# Patient Record
Sex: Male | Born: 1980 | Race: Black or African American | Hispanic: No | Marital: Single | Smoking: Current every day smoker
Health system: Southern US, Community
[De-identification: ages and names within clinical notes are randomized; demographics above are authoritative.]

## PROBLEM LIST (undated history)

## (undated) DIAGNOSIS — A539 Syphilis, unspecified: Secondary | ICD-10-CM

## (undated) DIAGNOSIS — B20 Human immunodeficiency virus [HIV] disease: Secondary | ICD-10-CM

## (undated) HISTORY — DX: Syphilis, unspecified: A53.9

## (undated) HISTORY — DX: Human immunodeficiency virus (HIV) disease: B20

---

## 2003-09-27 ENCOUNTER — Emergency Department (HOSPITAL_COMMUNITY): Admission: EM | Admit: 2003-09-27 | Discharge: 2003-09-27 | Payer: Self-pay | Admitting: Emergency Medicine

## 2004-10-03 ENCOUNTER — Emergency Department: Payer: Self-pay | Admitting: Emergency Medicine

## 2006-11-25 ENCOUNTER — Emergency Department: Payer: Self-pay | Admitting: Emergency Medicine

## 2008-04-20 IMAGING — CR DG SHOULDER 3+V*L*
1 series · 3 of 3 positions shown · non-contrast
Comparison: none

REASON FOR EXAM: injury   Minor care 1
COMMENTS:   LMP: (Male)

PROCEDURE:     DXR - DXR SHOULDER LEFT COMPLETE  - November 25, 2006  [DATE]
RESULT:     Comparison: No available comparison exam.

[Series 1: view not recorded · 0.17mm/px · 3 of 3 slices shown]
[im 1/3]
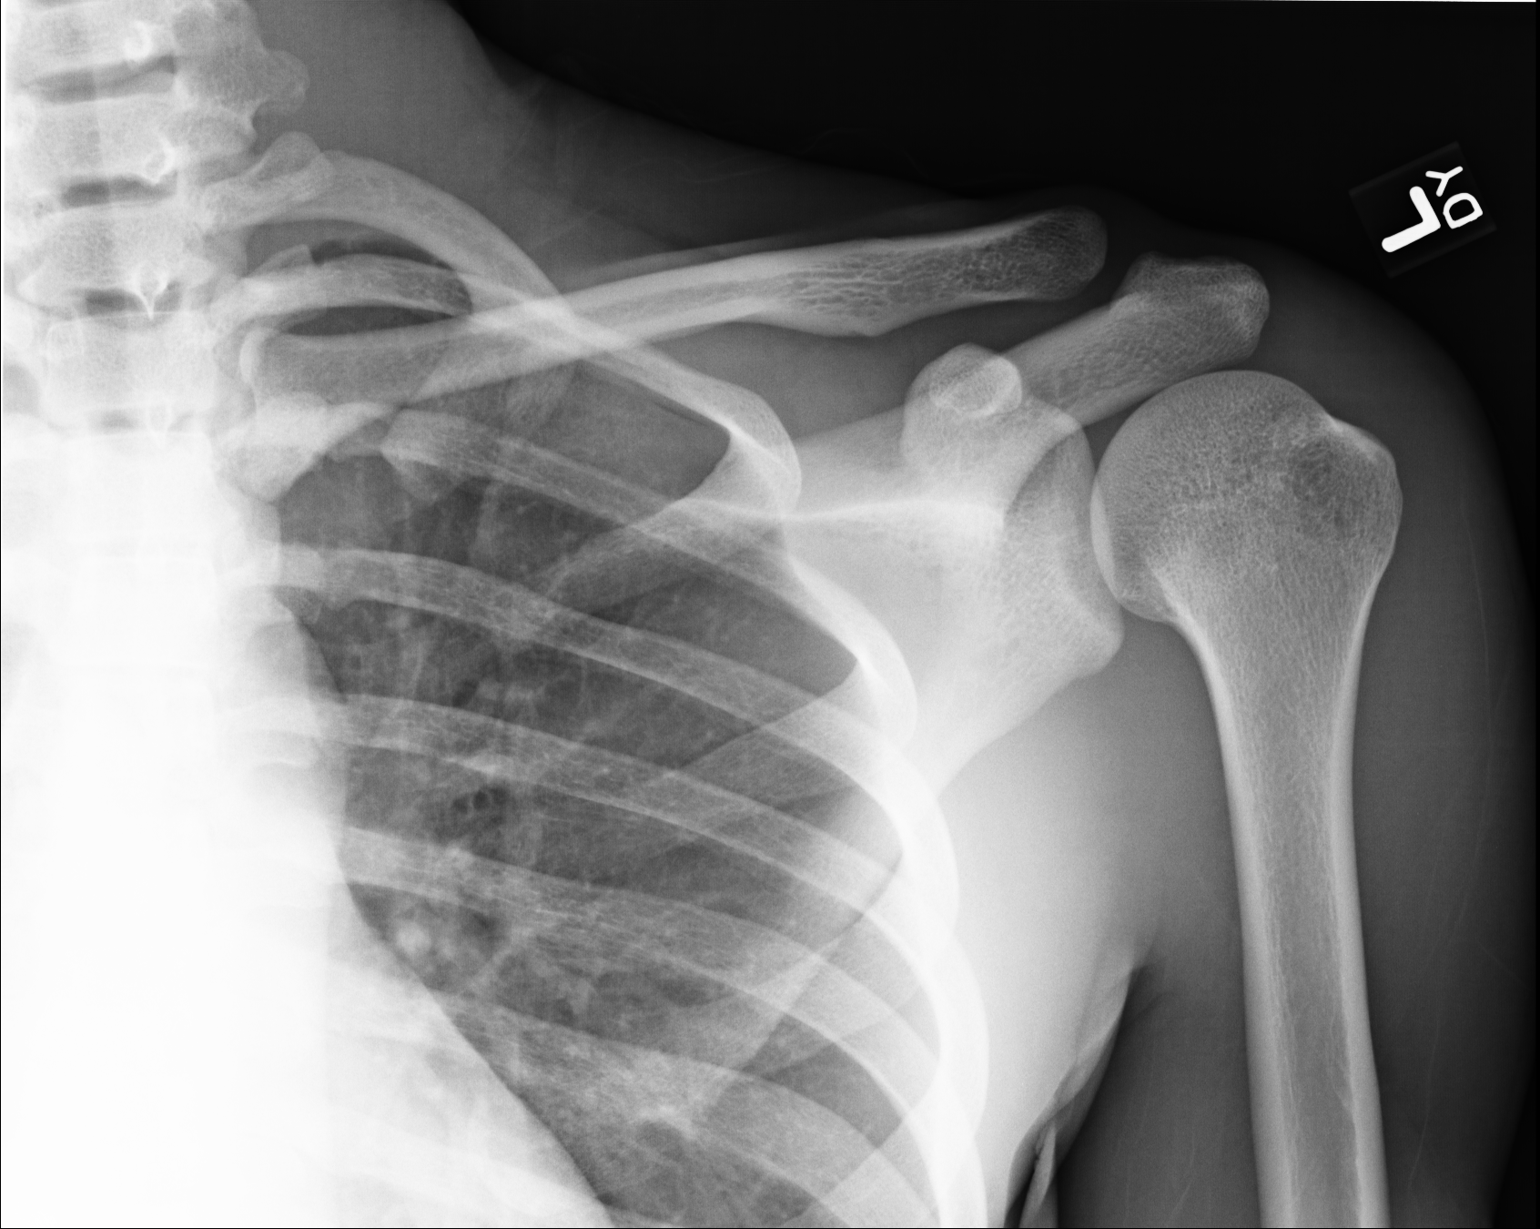
[im 2/3]
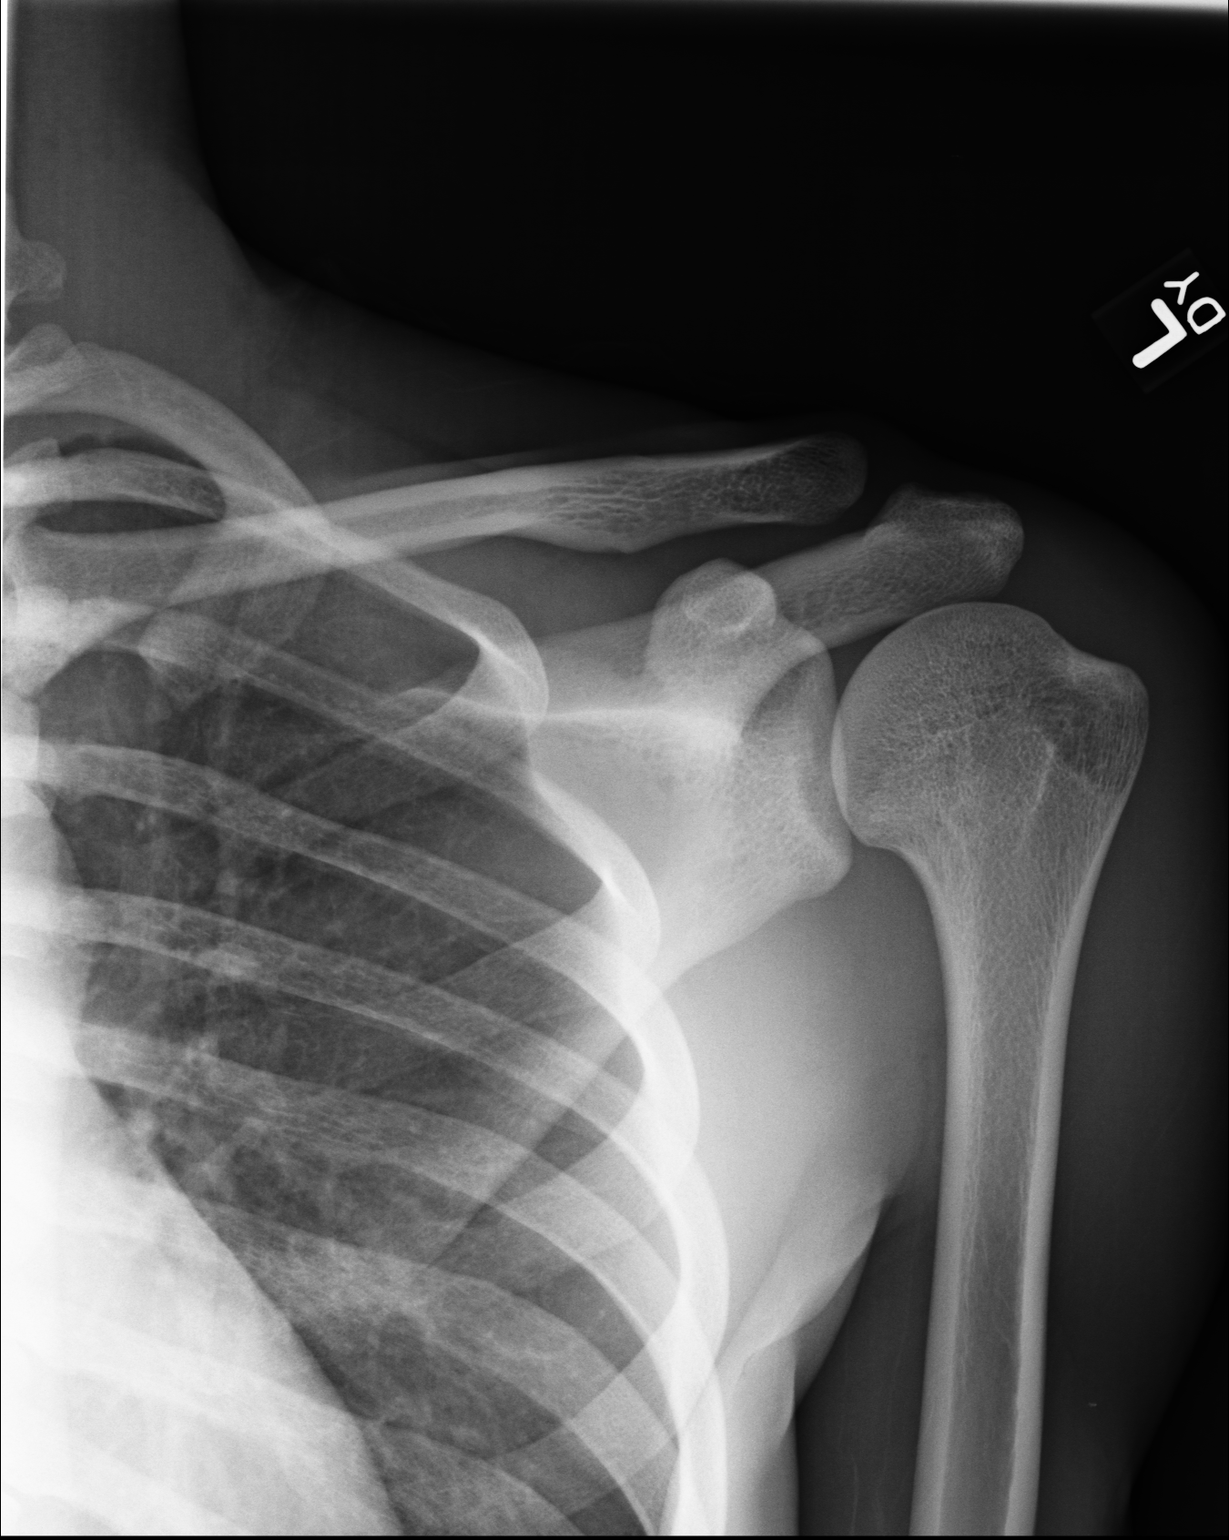
[im 3/3]
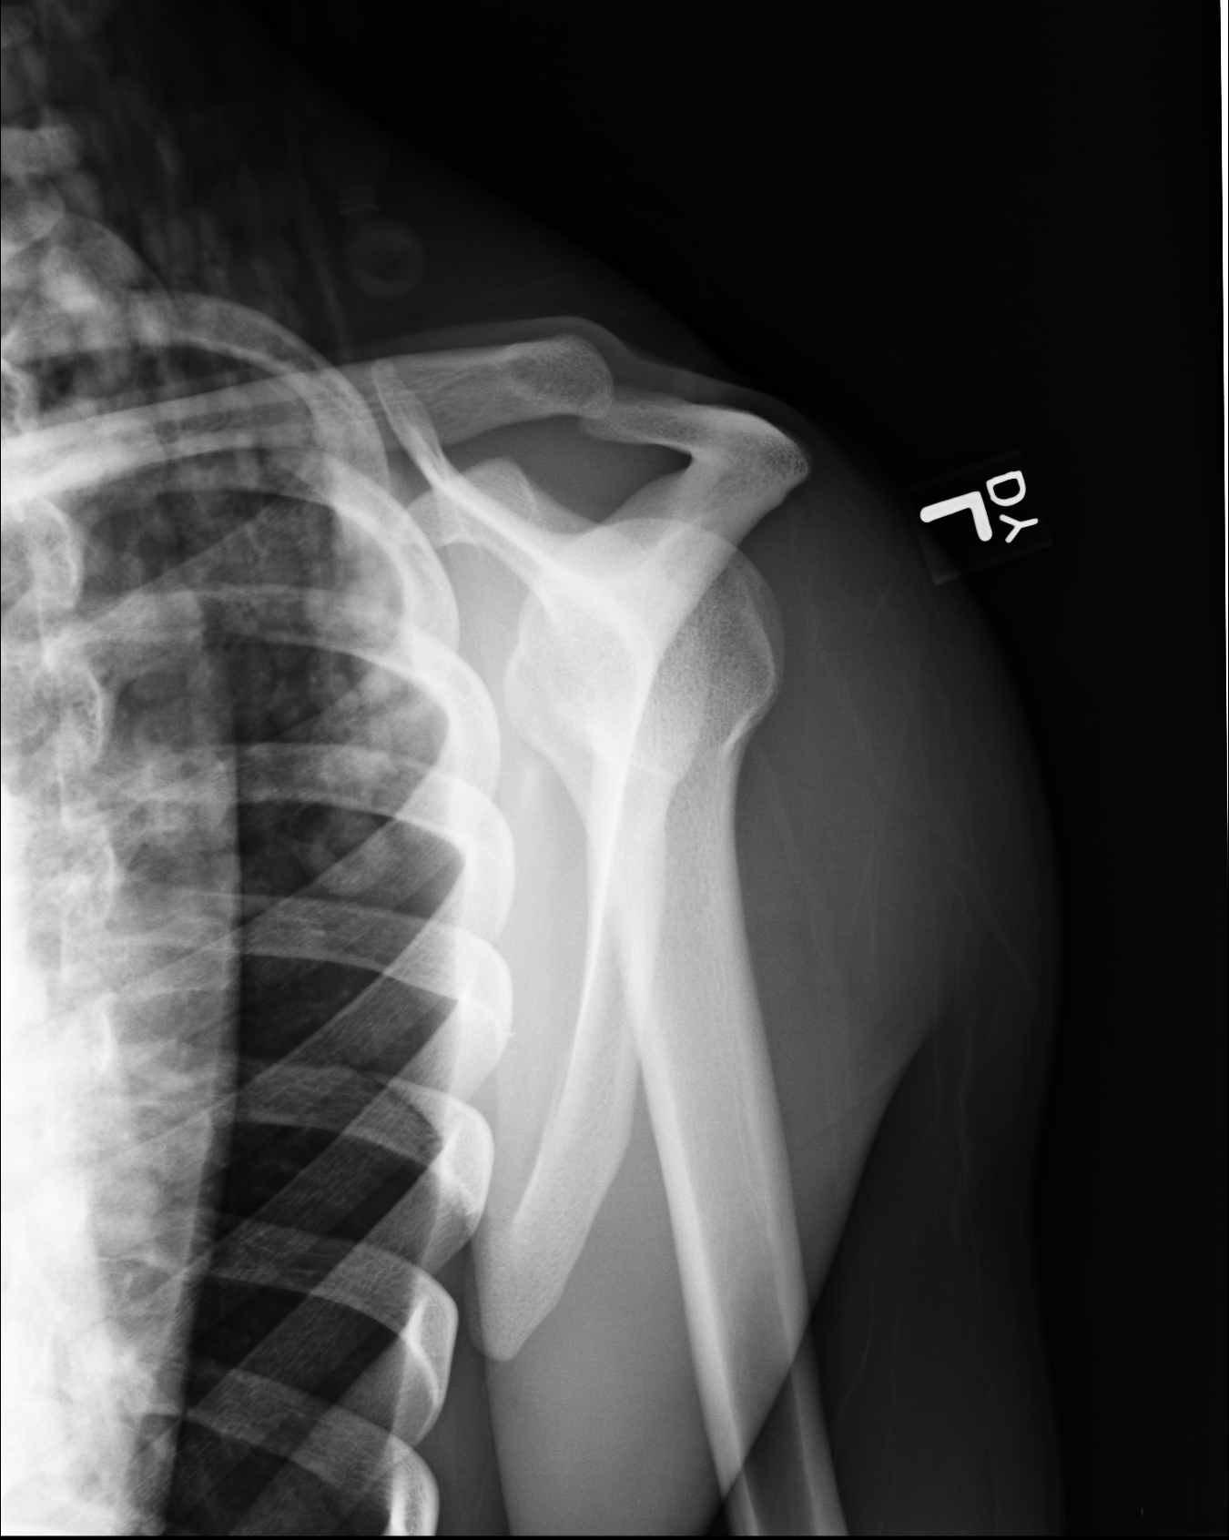

[3 of 3 positions shown; findings below may reference images not displayed]

FINDINGS: Three views of the left shoulder were obtained.

No fracture or dislocation of the left shoulder is seen. There is no
evidence of AC joint separation.
IMPRESSION: 1. Please see above.

## 2008-07-02 ENCOUNTER — Ambulatory Visit: Payer: Self-pay | Admitting: Internal Medicine

## 2008-07-02 DIAGNOSIS — B20 Human immunodeficiency virus [HIV] disease: Secondary | ICD-10-CM

## 2008-07-02 LAB — CONVERTED CEMR LAB
Basophils Absolute: 0.1 10*3/uL (ref 0.0–0.1)
Bilirubin Urine: NEGATIVE
CO2: 26 meq/L (ref 19–32)
Creatinine, Ser: 1.13 mg/dL (ref 0.40–1.50)
Eosinophils Absolute: 0.1 10*3/uL (ref 0.0–0.7)
GC Probe Amp, Urine: NEGATIVE
GFR calc Af Amer: >60 mL/min (ref >60)
GFR calc non Af Amer: >60 mL/min (ref >60)
Glucose, Bld: 78 mg/dL (ref 70–99)
HCV Ab: NEGATIVE
HDL: 54 mg/dL (ref >39)
HIV-2 Ab: UNDETERMINED — AB
Hep A Total Ab: POSITIVE — AB
Hep B S Ab: POSITIVE — AB
Hepatitis B Surface Ag: NEGATIVE
Ketones, ur: NEGATIVE mg/dL
Lymphocytes Relative: 25 % (ref 12–46)
Lymphs Abs: 1.5 10*3/uL (ref 0.7–4.0)
MCV: 71.4 fL — ABNORMAL LOW (ref 78.0–100.0)
Neutrophils Relative %: 66 % (ref 43–77)
Platelets: 213 10*3/uL (ref 150–400)
RDW: 14.8 % (ref 11.5–15.5)
Specific Gravity, Urine: 1.023 (ref 1.005–1.030)
Total Bilirubin: 1.4 mg/dL — ABNORMAL HIGH (ref 0.3–1.2)
Urine Glucose: NEGATIVE mg/dL
WBC: 5.8 10*3/uL (ref 4.0–10.5)
pH: 7 (ref 5.0–8.0)

## 2008-09-07 ENCOUNTER — Encounter: Payer: Self-pay | Admitting: Internal Medicine

## 2008-11-06 ENCOUNTER — Encounter (INDEPENDENT_AMBULATORY_CARE_PROVIDER_SITE_OTHER): Payer: Self-pay | Admitting: *Deleted

## 2009-03-23 ENCOUNTER — Telehealth (INDEPENDENT_AMBULATORY_CARE_PROVIDER_SITE_OTHER): Payer: Self-pay | Admitting: *Deleted

## 2009-05-26 ENCOUNTER — Ambulatory Visit: Payer: Self-pay | Admitting: Internal Medicine

## 2009-05-26 LAB — CONVERTED CEMR LAB
ALT: 24 units/L (ref 0–53)
Basophils Relative: 1 % (ref 0–1)
CO2: 28 meq/L (ref 19–32)
Calcium: 9 mg/dL (ref 8.4–10.5)
Chlamydia, Swab/Urine, PCR: NEGATIVE
Chloride: 105 meq/L (ref 96–112)
Creatinine, Ser: 1.11 mg/dL (ref 0.40–1.50)
Eosinophils Absolute: 0.1 10*3/uL (ref 0.0–0.7)
Glucose, Bld: 91 mg/dL (ref 70–99)
HCT: 39.7 % (ref 39.0–52.0)
HIV 1 RNA Quant: <48 copies/mL (ref <48)
HIV-1 RNA Quant, Log: <1.68 (ref <1.68)
Hemoglobin: 12.5 g/dL — ABNORMAL LOW (ref 13.0–17.0)
Lymphs Abs: 2.4 10*3/uL (ref 0.7–4.0)
MCHC: 31.5 g/dL (ref 30.0–36.0)
MCV: 73.2 fL — ABNORMAL LOW (ref 78.0–100.0)
Monocytes Absolute: 0.4 10*3/uL (ref 0.1–1.0)
Monocytes Relative: 6 % (ref 3–12)
RBC: 5.42 M/uL (ref 4.22–5.81)
Total Bilirubin: 0.8 mg/dL (ref 0.3–1.2)
Total Protein: 6.4 g/dL (ref 6.0–8.3)
WBC: 6.5 10*3/uL (ref 4.0–10.5)

## 2009-06-01 ENCOUNTER — Encounter: Payer: Self-pay | Admitting: Internal Medicine

## 2009-06-04 ENCOUNTER — Encounter (INDEPENDENT_AMBULATORY_CARE_PROVIDER_SITE_OTHER): Payer: Self-pay | Admitting: *Deleted

## 2009-06-10 ENCOUNTER — Ambulatory Visit: Payer: Self-pay | Admitting: Internal Medicine

## 2009-06-10 LAB — CONVERTED CEMR LAB
BUN: 15 mg/dL (ref 6–23)
Basophils Relative: 1 % (ref 0–1)
CO2: 27 meq/L (ref 19–32)
Calcium: 8.8 mg/dL (ref 8.4–10.5)
Chloride: 105 meq/L (ref 96–112)
Creatinine, Ser: 1.1 mg/dL (ref 0.40–1.50)
Eosinophils Absolute: 0.2 10*3/uL (ref 0.0–0.7)
Eosinophils Relative: 2 % (ref 0–5)
Glucose, Bld: 112 mg/dL — ABNORMAL HIGH (ref 70–99)
HCT: 42.4 % (ref 39.0–52.0)
HCV Ab: NEGATIVE
Hep A Total Ab: POSITIVE — AB
Hep B S Ab: POSITIVE — AB
Hepatitis B Surface Ag: NEGATIVE
Lymphs Abs: 2.8 10*3/uL (ref 0.7–4.0)
MCHC: 31.6 g/dL (ref 30.0–36.0)
MCV: 72 fL — ABNORMAL LOW (ref 78.0–100.0)
Monocytes Relative: 6 % (ref 3–12)
RBC: 5.89 M/uL — ABNORMAL HIGH (ref 4.22–5.81)
Total Bilirubin: 1.1 mg/dL (ref 0.3–1.2)
WBC: 7.4 10*3/uL (ref 4.0–10.5)

## 2009-06-16 ENCOUNTER — Encounter: Payer: Self-pay | Admitting: Internal Medicine

## 2009-06-16 ENCOUNTER — Encounter: Payer: Self-pay | Admitting: Infectious Diseases

## 2009-06-16 ENCOUNTER — Encounter (INDEPENDENT_AMBULATORY_CARE_PROVIDER_SITE_OTHER): Payer: Self-pay | Admitting: *Deleted

## 2009-06-25 ENCOUNTER — Encounter (INDEPENDENT_AMBULATORY_CARE_PROVIDER_SITE_OTHER): Payer: Self-pay | Admitting: *Deleted

## 2009-10-11 ENCOUNTER — Telehealth (INDEPENDENT_AMBULATORY_CARE_PROVIDER_SITE_OTHER): Payer: Self-pay | Admitting: *Deleted

## 2009-10-21 ENCOUNTER — Encounter (INDEPENDENT_AMBULATORY_CARE_PROVIDER_SITE_OTHER): Payer: Self-pay | Admitting: *Deleted

## 2009-10-27 ENCOUNTER — Telehealth (INDEPENDENT_AMBULATORY_CARE_PROVIDER_SITE_OTHER): Payer: Self-pay | Admitting: *Deleted

## 2009-11-25 ENCOUNTER — Ambulatory Visit: Payer: Self-pay | Admitting: Internal Medicine

## 2009-11-25 LAB — CONVERTED CEMR LAB
AST: 16 units/L (ref 0–37)
Albumin: 4.8 g/dL (ref 3.5–5.2)
Alkaline Phosphatase: 50 units/L (ref 39–117)
BUN: 21 mg/dL (ref 6–23)
Basophils Absolute: 0 10*3/uL (ref 0.0–0.1)
Basophils Relative: 0 % (ref 0–1)
Creatinine, Ser: 1.02 mg/dL (ref 0.40–1.50)
Eosinophils Absolute: 0.1 10*3/uL (ref 0.0–0.7)
MCHC: 31.7 g/dL (ref 30.0–36.0)
MCV: 71.5 fL — ABNORMAL LOW (ref 78.0–100.0)
Monocytes Relative: 9 % (ref 3–12)
Neutrophils Relative %: 64 % (ref 43–77)
Potassium: 4.7 meq/L (ref 3.5–5.3)
RBC: 5.12 M/uL (ref 4.22–5.81)
RDW: 17 % — ABNORMAL HIGH (ref 11.5–15.5)

## 2009-11-26 ENCOUNTER — Telehealth (INDEPENDENT_AMBULATORY_CARE_PROVIDER_SITE_OTHER): Payer: Self-pay | Admitting: *Deleted

## 2009-11-26 ENCOUNTER — Encounter: Payer: Self-pay | Admitting: Internal Medicine

## 2009-12-09 ENCOUNTER — Ambulatory Visit: Payer: Self-pay | Admitting: Internal Medicine

## 2009-12-09 ENCOUNTER — Telehealth (INDEPENDENT_AMBULATORY_CARE_PROVIDER_SITE_OTHER): Payer: Self-pay | Admitting: *Deleted

## 2010-02-23 NOTE — Progress Notes (Signed)
Summary: Patient Assistance - MegaceES  Phone Note Outgoing Call   Summary of Call: Advised patient that his Medication MegaceES has arrived and that he can pickup. Initial call taken by: Altamease Oiler,  October 27, 2009 5:29 PM

## 2010-02-23 NOTE — Progress Notes (Signed)
  Phone Note Outgoing Call   Call placed by: Sharol Roussel Summary of Call: patient to be enrolled into bridge counseling on 03/30/09

## 2010-02-23 NOTE — Miscellaneous (Signed)
Summary: Bridge Counselor  Clinical Lists Changes Pt enrolled into the [facility] program on 03/30/09. Pt has recently moved to the [place] area from [place], [place]. Pt prefers to be addressed as "[place]."  Sharol Roussel  Jun 04, 2009 2:25 PM

## 2010-02-23 NOTE — Progress Notes (Signed)
Summary: Megace samples  Phone Note Outgoing Call   Call placed by: Annice Pih Summary of Call: Left message for pt. that Megace samples are here and ready for pick up Initial call taken by: Wendall Mola CMA Duncan Dull),  December 09, 2009 12:16 PM

## 2010-02-23 NOTE — Miscellaneous (Signed)
Summary: clinical update/ryan white  Clinical Lists Changes  Observations: Added new observation of PATNTCOUNTY: [place] (10/21/2009 14:45) Added new observation of AIDSDAP: No (10/21/2009 14:45) Added new observation of PCTFPL: 101.28  (10/21/2009 14:45) Added new observation of HOUSEINCOME: [address]  (10/21/2009 14:45) Added new observation of FINASSESSDT: 10/21/2009  (10/21/2009 14:45)

## 2010-02-23 NOTE — Assessment & Plan Note (Signed)
Summary: new 042 labskam    Other Orders: T-CD4SP [facility]) (CD4SP) T-CBC w/Diff [phone]) T-Comprehensive Metabolic Panel [id number]) T-HIV Viral Load [phone]) T-RPR (Syphilis) [phone]) T-Chlamydia  Probe, urine [phone]) T-GC Probe, urine [phone])

## 2010-02-23 NOTE — Miscellaneous (Signed)
Summary: Bridge Counselor  Clinical Lists Changes  BC closed pt's file today. Pt was referred to [facility] for long term case management.  Sharol Roussel  June 25, 2009 12:43 PM

## 2010-02-23 NOTE — Assessment & Plan Note (Signed)
Summary: 28month f/u [mkj]   CC:  follow-up visit, lab results, refill Megace, and c/o right wrist pain.  History of Present Illness: patient is here for follow-up on his lab results.  He has been feeling well.  He has gained about 11 pounds since his last visit which he is very happy.  He needs a refill on his Megace  Preventive Screening-Counseling & Management  Alcohol-Tobacco     Alcohol drinks/day: 0     Smoking Status: current     Smoking Cessation Counseling: yes     Packs/Day: 0.5     Year Started: 2004  Caffeine-Diet-Exercise     Caffeine use/day: tea and soda 2 per day     Does Patient Exercise: yes     Type of exercise: yoga, dance, weights     Exercise (avg: min/session): >60     Times/week: 4  Safety-Violence-Falls     Seat Belt Use: yes      Drug Use:  Yes marijuana.    Comments: pt. given condoms   Updated Prior Medication List: MEGACE ES 625 MG/5ML SUSP (MEGESTROL ACETATE) 5 ml by mouth every day  Current Allergies (reviewed today): No known allergies  Review of Systems  The patient denies anorexia, fever, prolonged cough, and headaches.    Vital Signs:  Patient profile:   30 year old male Height:      70.5 inches (179.07 cm) Weight:      155.8 pounds (70.82 kg) BMI:     22.12 Temp:     99.4 degrees F (37.44 degrees C) oral Pulse rate:   67 / minute BP sitting:   130 / 87  (right arm)  Vitals Entered By: Wendall Mola CMA Duncan Dull) (December 09, 2009 9:42 AM) CC: follow-up visit, lab results, refill Megace, c/o right wrist pain Is Patient Diabetic? No Pain Assessment Patient in pain? yes     Location: right wrist Intensity: 4 Type: aching Onset of pain  Intermittent Nutritional Status BMI of 19 -24 = normal Nutritional Status Detail appetite "good"  Have you ever been in a relationship where you felt threatened, hurt or afraid?No   Does patient need assistance? Functional Status Self care Ambulation Normal   Physical  Exam  General:  alert, well-developed, well-nourished, and well-hydrated.   Head:  normocephalic and atraumatic.   Mouth:  pharynx pink and moist.   Lungs:  normal breath sounds.      Impression & Recommendations:  Problem # 1:  HIV INFECTION (ICD-042) patient is currently asymptomatic and not on medication.  He will follow-up for repeat labs in 6 months and see me two weeks after the period Diagnostics Reviewed:  HIV: REACTIVE (07/02/2008)   HIV-Western blot: Positive (06/10/2009)   CD4: 750 (11/26/2009)   WBC: 8.7 (11/25/2009)   Hgb: 11.6 (11/25/2009)   HCT: 36.6 (11/25/2009)   Platelets: 272 (11/25/2009) HIV-1 RNA: <20 copies/mL (11/25/2009)   HBSAg: NEG (06/10/2009)  Orders: Est. Patient Level III (99213)Future Orders: T-CD4SP ([facility]) (CD4SP) ... 06/07/2010 T-HIV Viral Load [phone]) ... 06/07/2010 T-Comprehensive Metabolic Panel [phone]) ... 06/07/2010 T-CBC w/Diff ([id number]) ... 06/07/2010  Patient Instructions: 1)  Please schedule a follow-up appointment in 6 months,2 weeks after labs.  Prescriptions: MEGACE ES 625 MG/5ML SUSP (MEGESTROL ACETATE) 5 ml by mouth every day  #150 ml x 2   Entered and Authorized by:   Yisroel Ramming MD   Signed by:   Yisroel Ramming MD on 12/09/2009   Method used:   Print then  Give to Patient   RxID:   [id number]    Not Administered:    Influenza Vaccine not given due to: declined

## 2010-02-23 NOTE — Consult Note (Signed)
Summary: New Pt. Referral:  New Pt. Referral:   Imported By: Florinda Marker 06/16/2009 15:21:13  _____________________________________________________________________  External Attachment:    Type:   Image     Comment:   External Document

## 2010-02-23 NOTE — Assessment & Plan Note (Signed)
Summary: new 042 labs 5/4   CC:  new patient and lab results.  History of Present Illness: This is the first ID clinic visit for [facility]. He was daignosed HIV (+) in 2007.  He has not been in care since his diagnosis. His risk factor is MSM. No current partner. He has always been small and would like to gain weight. He works out all of the time.  Preventive Screening-Counseling & Management  Alcohol-Tobacco     Alcohol drinks/day: 0     Smoking Status: current     Smoking Cessation Counseling: yes     Packs/Day: 0.5     Year Started: 2004  Caffeine-Diet-Exercise     Caffeine use/day: tea and soda 2 per day     Does Patient Exercise: yes     Type of exercise: yoga, dance, weights     Exercise (avg: min/session): >60     Times/week: 4  Safety-Violence-Falls     Seat Belt Use: yes      Drug Use:  Yes marijuana.    Comments: pt given condoms   Updated Prior Medication List: MEGACE ES 625 MG/5ML SUSP (MEGESTROL ACETATE) 5 ml by mouth every day  Current Allergies (reviewed today): No known allergies  Social History: Drug Use:  Yes marijuana  Review of Systems  The patient denies anorexia, fever, weight loss, and dyspnea on exertion.    Vital Signs:  Patient profile:   30 year old male Height:      70.5 inches (179.07 cm) Weight:      138.0 pounds (62.73 kg) BMI:     19.59 Temp:     98.0 degrees F (36.67 degrees C) oral Pulse rate:   69 / minute BP sitting:   120 / 75  (right arm)  Vitals Entered By: Wendall Mola CMA Duncan Dull) (Jun 10, 2009 2:14 PM) CC: new patient, lab results Is Patient Diabetic? No Pain Assessment Patient in pain? no      Nutritional Status BMI of 19 -24 = normal Nutritional Status Detail appetite "good"  Have you ever been in a relationship where you felt threatened, hurt or afraid?No   Does patient need assistance? Functional Status Self care Ambulation Normal   Physical Exam  General:  alert, well-hydrated, and underweight  appearing.   Head:  normocephalic and atraumatic.   Mouth:  pharynx pink and moist.  no thrush  Neck:  no cervical lymphadenopathy.   Lungs:  normal breath sounds.   Heart:  normal rate and regular rhythm.      Impression & Recommendations:  Problem # 1:  HIV INFECTION (ICD-042) Pt currently asymptomatic and not on treatment. VL <48 and CD4ct 980.  I will obtain a confirmatory HIV test. Repeat labs in 6 months if test comes back positive. megace to stimulate appetite. Orders: New Patient Level III (91478) T-Hepatitis B Surface Antigen [phone]) T-Hepatitis B Surface Antibody ([id number]) T-Hepatitis C Antibody ([id number]) T-Hepatitis A Antibody ([id number]) T-HIV Ab Confirmatory Test/Western Blot ([id number])LOVFIE Orders: T-CD4SP ([facility]) (CD4SP) ... 12/07/2009 T-HIV Viral Load [phone]) ... 12/07/2009 T-Comprehensive Metabolic Panel [id number]) ... 12/07/2009 T-CBC w/Diff ([id number]) ... 12/07/2009  Diagnostics Reviewed:  HIV: REACTIVE (07/02/2008)   HIV-Western blot: Positive (07/02/2008)   CD4: 980 (05/27/2009)   WBC: 6.5 (05/26/2009)   Hgb: 12.5 (05/26/2009)   HCT: 39.7 (05/26/2009)   Platelets: 207 (05/26/2009) HIV-1 RNA: <48 copies/mL (05/26/2009)   HBSAg: NEG (07/02/2008)  Medications Added to Medication List This Visit: 1)  Megace Es 625 Mg/51ml Susp (  Megestrol acetate) .... 5 ml by mouth every day  Patient Instructions: 1)  Please schedule a follow-up appointment in 6 months, 2 weeks after labs.  Prescriptions: MEGACE ES 625 MG/5ML SUSP (MEGESTROL ACETATE) 5 ml by mouth every day  #150 ml x 2   Entered and Authorized by:   Yisroel Ramming MD   Signed by:   Yisroel Ramming MD on 06/10/2009   Method used:   Print then Give to Patient   RxID:   [id number]

## 2010-02-23 NOTE — Miscellaneous (Signed)
Summary: Orders Update  Clinical Lists Changes  Orders: Added new Referral order of Misc. Referral (Misc. Ref) - Signed 

## 2010-02-23 NOTE — Progress Notes (Signed)
Summary: PPD  Phone Note Outgoing Call   Call placed by: Annice Pih Summary of Call: Pt. needs PPD at next office visit Initial call taken by: Wendall Mola CMA Duncan Dull),  October 11, 2009 12:59 PM

## 2010-02-23 NOTE — Progress Notes (Signed)
Summary: PAP rxes here.  Letter sent to pt.   Phone Note Outgoing Call   Call placed by: Jennet Maduro RN,  November 26, 2009 1:58 PM Call placed to: Patient Action Taken: Assistance medications ready for pick up Summary of Call: Megace PAP Lot # [id number] Exp. June 2014 # 150 mL Unable to leave message for the pt.  Phone #s have been disconnected.  Jennet Maduro RN  November 26, 2009 1:59 PM Letter Mailed to pt.  Jennet Maduro RN  November 26, 2009 2:10 PM

## 2010-02-23 NOTE — Letter (Signed)
Summary: [facility]  [address]   [place], [place] [address]   Phone: [phone]  Fax: [phone]          November 26, 2009  [place] [address] [place], [place]  [address]  Dear Mr. Vanderschaaf,  Your Patient Assistance Medications have arrived at the Center for your pick up.  Please come to the Center between 9-12:30 PM or 2:00-4:30 PM Monday through Friday.  Thank you.  Sincerely,    Jennet Maduro RN

## 2010-02-23 NOTE — Miscellaneous (Signed)
Summary: Bridge Counselor  Clinical Lists Changes BC met pt at the clinic for his 06/10/09 appointment following a home visit. Pt is doing well, has a job, transportation and is now close to the clinic. Pt does not plan to miss anymore appointments. BC introduced pt to Amy Faw with [facility] and will refer ct to [facility] for support services. BC will close this pt's file at the end of the month.  Kim Poff  Jun 16, 2009 1:12 PM

## 2010-02-23 NOTE — Miscellaneous (Signed)
Summary: [facility]izations Records   [facility] Records   Imported By: Florinda Marker 06/16/2009 13:58:11  _____________________________________________________________________  External Attachment:    Type:   Image     Comment:   External Document

## 2010-04-05 LAB — T-HELPER CELL (CD4) - (RCID CLINIC ONLY)
CD4 % Helper T Cell: 37 % (ref 33–55)
CD4 T Cell Abs: 750 uL (ref 400–2700)

## 2010-04-11 LAB — T-HELPER CELL (CD4) - (RCID CLINIC ONLY)
CD4 % Helper T Cell: 46 % (ref 33–55)
CD4 T Cell Abs: 1240 uL (ref 400–2700)

## 2010-04-12 LAB — T-HELPER CELL (CD4) - (RCID CLINIC ONLY)
CD4 % Helper T Cell: 43 % (ref 33–55)
CD4 T Cell Abs: 980 uL (ref 400–2700)

## 2010-05-02 LAB — T-HELPER CELL (CD4) - (RCID CLINIC ONLY): CD4 T Cell Abs: 650 uL (ref 400–2700)

## 2013-12-07 ENCOUNTER — Encounter (HOSPITAL_COMMUNITY): Payer: Self-pay

## 2013-12-07 ENCOUNTER — Emergency Department (HOSPITAL_COMMUNITY)
Admission: EM | Admit: 2013-12-07 | Discharge: 2013-12-07 | Discharge status: Against Medical Advice | Payer: Self-pay | Attending: Emergency Medicine | Admitting: Emergency Medicine

## 2013-12-07 ENCOUNTER — Emergency Department (HOSPITAL_COMMUNITY)
Admission: EM | Admit: 2013-12-07 | Discharge: 2013-12-07 | Disposition: A | Discharge status: Home / Self Care | Payer: Self-pay | Attending: Emergency Medicine | Admitting: Emergency Medicine

## 2013-12-07 DIAGNOSIS — Z720 Tobacco use: Secondary | ICD-10-CM | POA: Insufficient documentation

## 2013-12-07 DIAGNOSIS — K029 Dental caries, unspecified: Secondary | ICD-10-CM | POA: Insufficient documentation

## 2013-12-07 DIAGNOSIS — K088 Other specified disorders of teeth and supporting structures: Secondary | ICD-10-CM | POA: Insufficient documentation

## 2013-12-07 DIAGNOSIS — K047 Periapical abscess without sinus: Secondary | ICD-10-CM | POA: Insufficient documentation

## 2013-12-07 MED ORDER — CLINDAMYCIN HCL 300 MG PO CAPS: 300.0000 mg | ORAL_CAPSULE | Freq: Four times a day (QID) | ORAL | Status: DC

## 2013-12-07 MED ORDER — CLINDAMYCIN HCL 150 MG PO CAPS
300.0000 mg | ORAL_CAPSULE | Freq: Once | ORAL | Status: AC
  Administered 2013-12-07: 300 mg via ORAL
  Filled 2013-12-07: qty 2

## 2013-12-07 MED ORDER — HYDROCODONE-ACETAMINOPHEN 5-325 MG PO TABS: ORAL_TABLET | ORAL | Status: DC

## 2013-12-07 NOTE — Discharge Instructions (Signed)
Abscessed Tooth An abscessed tooth is an infection around your tooth. It may be caused by holes or damage to the tooth (cavity) or a dental disease. An abscessed tooth causes mild to very bad pain in and around the tooth. See your dentist right away if you have tooth or gum pain. HOME CARE  Take your medicine as told. Finish it even if you start to feel better.  Do not drive after taking pain medicine.  Rinse your mouth (gargle) often with salt water ( teaspoon salt in 8 ounces of warm water).  Do not apply heat to the outside of your face. GET HELP RIGHT AWAY IF:   You have a temperature by mouth above 102 F (38.9 C), not controlled by medicine.  You have chills and a very bad headache.  You have problems breathing or swallowing.  Your mouth will not open.  You develop puffiness (swelling) on the neck or around the eye.  Your pain is not helped by medicine.  Your pain is getting worse instead of better. MAKE SURE YOU:   Understand these instructions.  Will watch your condition.  Will get help right away if you are not doing well or get worse. Document Released: 06/28/2007 Document Revised: 04/03/2011 Document Reviewed: 04/19/2010 ExitCare Patient Information 2015 [employer]. This information is not intended to replace advice given to you by your health care provider. Make sure you discuss any questions you have with your health care provider.  

## 2013-12-07 NOTE — ED Provider Notes (Signed)
CSN: [id number]     Arrival date & time 12/07/13  1907 History  This chart was scribed for non-physician practitioner working with Donnetta HutchingBrian Cook, MD by Murriel HopperAlec Bankhead, ED Scribe. This patient was seen in room APFT21/APFT21 and the patient's care was started at 7:47 PM.    Chief Complaint  Patient presents with  . Abscess      The history is provided by the patient. No language interpreter was used.     HPI Comments: Taylor Mcguire is a 33 y.o. male who presents to the Emergency Department complaining of pain due to an abscessed tooth with associated facial swelling that has been present for about a week. Pt states the area ruptured and drainage of the abscess occurred PTA and that the pain is localized to his right lower jaw. Pt notes that he recently got a tongue ring and that he has a few chipped teeth, which could be contributing to the complaint. Pt denies fever or chills, difficulty swallowing or breathing.      History reviewed. No pertinent past medical history. History reviewed. No pertinent past surgical history. History reviewed. No pertinent family history. History  Substance Use Topics  . Smoking status: Current Every Day Smoker  . Smokeless tobacco: Not on file  . Alcohol Use: Yes     Comment: occ    Review of Systems  Constitutional: Negative for fever, chills and appetite change.  HENT: Positive for dental problem and facial swelling. Negative for congestion, sore throat and trouble swallowing.   Eyes: Negative for pain and visual disturbance.  Musculoskeletal: Negative for neck pain and neck stiffness.  Neurological: Negative for dizziness, facial asymmetry, speech difficulty, weakness and headaches.  Hematological: Negative for adenopathy.  All other systems reviewed and are negative.     Allergies  Review of patient's allergies indicates no known allergies.  Home Medications   Prior to Admission medications   Not on File   BP 136/80 mmHg  Pulse 70   Temp(Src) 99.2 F (37.3 C) (Oral)  Resp 20  Ht 5\' 10"  (1.778 m)  Wt 145 lb (65.772 kg)  BMI 20.81 kg/m2  SpO2 99% Physical Exam  Constitutional: He is oriented to person, place, and time. He appears well-developed and well-nourished. No distress.  HENT:  Head: Normocephalic and atraumatic.  Right Ear: Tympanic membrane and ear canal normal.  Left Ear: Tympanic membrane and ear canal normal.  Mouth/Throat: Uvula is midline, oropharynx is clear and moist and mucous membranes are normal. No trismus in the jaw. Dental caries present. No dental abscesses or uvula swelling.    Right lower 2nd premolar dental abscess with caries present. Moderate drainage Airway patent  Neck: Normal range of motion. Neck supple.  Cardiovascular: Normal rate, regular rhythm and normal heart sounds.   No murmur heard. Pulmonary/Chest: Effort normal and breath sounds normal.  Abdominal: He exhibits no distension.  Musculoskeletal: Normal range of motion.  Lymphadenopathy:    He has no cervical adenopathy.  Neurological: He is alert and oriented to person, place, and time. He exhibits normal muscle tone. Coordination normal.  Skin: Skin is warm and dry.  Psychiatric: He has a normal mood and affect.  Nursing note and vitals reviewed.   ED Course  Procedures (including critical care time)  DIAGNOSTIC STUDIES: Oxygen Saturation is 99% on RA, normal by my interpretation.    COORDINATION OF CARE: 7:51 PM Discussed treatment plan with pt at bedside and pt agreed to plan.   Labs Review Labs  Reviewed - No data to display  Imaging Review No results found.   EKG Interpretation None      MDM   Final diagnoses:  Dental abscess    Pt is well appearing.  No concerning sx's for infection to floor of the mouth or deep structures of the neck.  Referral info given for dentist.  Rx for clindamycin and vicodin.   I personally performed the services described in this documentation, which was scribed in  my presence. The recorded information has been reviewed and is accurate.    Zemirah Krasinski L. Trisha Mangleriplett, PA-C 12/08/13 0026  Donnetta HutchingBrian Cook, MD 12/11/13 2132

## 2013-12-07 NOTE — ED Notes (Signed)
Patient c/o abscess to right lower jaw that started a week ago. Patient states drainage that started upon arrival.

## 2013-12-07 NOTE — ED Notes (Signed)
Called name no answer 

## 2013-12-07 NOTE — ED Notes (Signed)
Called name second time.  No answer.

## 2013-12-07 NOTE — ED Notes (Signed)
Pt c/o toothache since last week and swelling to r jaw.

## 2016-06-16 ENCOUNTER — Encounter (HOSPITAL_COMMUNITY): Payer: Self-pay | Admitting: *Deleted

## 2016-06-16 ENCOUNTER — Emergency Department (HOSPITAL_COMMUNITY)
Admission: EM | Admit: 2016-06-16 | Discharge: 2016-06-16 | Disposition: A | Discharge status: Home / Self Care | Payer: BLUE CROSS/BLUE SHIELD | Attending: Emergency Medicine | Admitting: Emergency Medicine

## 2016-06-16 DIAGNOSIS — K047 Periapical abscess without sinus: Secondary | ICD-10-CM | POA: Insufficient documentation

## 2016-06-16 DIAGNOSIS — F17200 Nicotine dependence, unspecified, uncomplicated: Secondary | ICD-10-CM | POA: Diagnosis not present

## 2016-06-16 DIAGNOSIS — Z21 Asymptomatic human immunodeficiency virus [HIV] infection status: Secondary | ICD-10-CM | POA: Diagnosis not present

## 2016-06-16 DIAGNOSIS — B20 Human immunodeficiency virus [HIV] disease: Secondary | ICD-10-CM

## 2016-06-16 DIAGNOSIS — K0889 Other specified disorders of teeth and supporting structures: Secondary | ICD-10-CM | POA: Diagnosis present

## 2016-06-16 MED ORDER — ONDANSETRON HCL 4 MG PO TABS
4.0000 mg | ORAL_TABLET | Freq: Once | ORAL | Status: AC
  Administered 2016-06-16: 4 mg via ORAL
  Filled 2016-06-16: qty 1

## 2016-06-16 MED ORDER — TRAMADOL HCL 50 MG PO TABS: ORAL_TABLET | ORAL | 0 refills | Status: DC

## 2016-06-16 MED ORDER — IBUPROFEN 600 MG PO TABS: 600.0000 mg | ORAL_TABLET | Freq: Four times a day (QID) | ORAL | 0 refills | Status: DC

## 2016-06-16 MED ORDER — IBUPROFEN 800 MG PO TABS
800.0000 mg | ORAL_TABLET | Freq: Once | ORAL | Status: AC
  Administered 2016-06-16: 800 mg via ORAL
  Filled 2016-06-16: qty 1

## 2016-06-16 MED ORDER — CLINDAMYCIN HCL 150 MG PO CAPS
300.0000 mg | ORAL_CAPSULE | Freq: Once | ORAL | Status: AC
  Administered 2016-06-16: 300 mg via ORAL
  Filled 2016-06-16: qty 2

## 2016-06-16 MED ORDER — CLINDAMYCIN HCL 300 MG PO CAPS: 300.0000 mg | ORAL_CAPSULE | Freq: Three times a day (TID) | ORAL | 0 refills | Status: DC

## 2016-06-16 NOTE — ED Triage Notes (Signed)
Pt has a reported abscess to lower R tooth-  Pt has dental insurance, but will need a referral

## 2016-06-16 NOTE — ED Provider Notes (Signed)
[facility]-EMERG[facility]Y DEPT Provider Note   CSN: [id number] Arrival date & time: 06/16/16  2056     History   Chief Complaint No chief complaint on file.   HPI Taylor Mcguire is a 36 y.o. male.  Pt states he is has hx of HIV infection. States it has been years since he was seen for this problem. He does not remember his latest CD4 count.   The history is provided by the patient.  Dental Pain   This is a new problem. The current episode started 2 days ago. The problem occurs constantly. The problem has been gradually worsening. The pain is moderate. He has tried acetaminophen for the symptoms. The treatment provided no relief.    No past medical history on file.  Patient Active Problem List   Diagnosis Date Noted  . HIV INFECTION 07/02/2008    No past surgical history on file.     Home Medications    Prior to Admission medications   Medication Sig Start Date End Date Taking? Authorizing Provider  clindamycin (CLEOCIN) 300 MG capsule Take 1 capsule (300 mg total) by mouth 4 (four) times daily. For 10 days 12/07/13   Pauline Ausriplett, Tammy, PA-C  HYDROcodone-acetaminophen (NORCO/VICODIN) 5-325 MG per tablet Take one-two tabs po q 4-6 hrs prn pain 12/07/13   Triplett, Tammy, PA-C    Family History No family history on file.  Social History Social History  Substance Use Topics  . Smoking status: Current Every Day Smoker  . Smokeless tobacco: Not on file  . Alcohol use Yes     Comment: occ     Allergies   Patient has no known allergies.   Review of Systems Review of Systems  Constitutional: Negative for activity change and appetite change.  HENT: Positive for dental problem. Negative for congestion, ear discharge, ear pain, facial swelling, nosebleeds, rhinorrhea, sneezing and tinnitus.   Eyes: Negative for photophobia, pain and discharge.  Respiratory: Negative for cough, choking, shortness of breath and wheezing.   Cardiovascular: Negative for chest pain, palpitations and  leg swelling.  Gastrointestinal: Negative for abdominal pain, blood in stool, constipation, diarrhea, nausea and vomiting.  Genitourinary: Negative for difficulty urinating, dysuria, flank pain, frequency and hematuria.  Musculoskeletal: Negative for back pain, gait problem, myalgias and neck pain.  Skin: Negative for color change, rash and wound.  Neurological: Negative for dizziness, seizures, syncope, facial asymmetry, speech difficulty, weakness and numbness.  Hematological: Negative for adenopathy. Does not bruise/bleed easily.  Psychiatric/Behavioral: Negative for agitation, confusion, hallucinations, self-injury and suicidal ideas. The patient is not nervous/anxious.      Physical Exam Updated Vital Signs There were no vitals taken for this visit.  Physical Exam  Constitutional: He is oriented to person, place, and time. He appears well-developed and well-nourished.  Non-toxic appearance.  HENT:  Head: Normocephalic.  Right Ear: Tympanic membrane and external ear normal.  Left Ear: Tympanic membrane and external ear normal.  Mouth/Throat:    There are multiple dental caries on the right. There is swelling of the gum on the right lower jaw area. There is tenderness to palpation. There is no swelling under the tongue. Airway is patent. Uvula is in the midline.  Eyes: EOM and lids are normal. Pupils are equal, round, and reactive to light.  Neck: Normal range of motion. Neck supple. Carotid bruit is not present.  Few palpable submental nodes appreciated.  Cardiovascular: Normal rate, regular rhythm, normal heart sounds, intact distal pulses and normal pulses.   Pulmonary/Chest: Breath  sounds normal. No respiratory distress.  Abdominal: Soft. Bowel sounds are normal. There is no tenderness. There is no guarding.  Musculoskeletal: Normal range of motion.  Lymphadenopathy:       Head (right side): No submandibular adenopathy present.       Head (left side): No submandibular  adenopathy present.    He has no cervical adenopathy.  Neurological: He is alert and oriented to person, place, and time. He has normal strength. No cranial nerve deficit or sensory deficit.  Skin: Skin is warm and dry.  Psychiatric: He has a normal mood and affect. His speech is normal.  Nursing note and vitals reviewed.    ED Treatments / Results  Labs (all labs ordered are listed, but only abnormal results are displayed) Labs Reviewed - No data to display  EKG  EKG Interpretation None       Radiology No results found.  Procedures Procedures (including critical care time)  Medications Ordered in ED Medications - No data to display   Initial Impression / Assessment and Plan / ED Course  I have reviewed the triage vital signs and the nursing notes.  Pertinent labs & imaging results that were available during my care of the patient were reviewed by me and considered in my medical decision making (see chart for details).      Final Clinical Impressions(s) / ED Diagnoses mdM Vital signs stable. Examination reveals swelling of the lower jaw on the right. Patient has dental caries present. No visible abscess appreciated. The patient is HIV positive, and states that he is not had any evaluation or treatment for this in over a year. I strongly encouraged patient to see the members of th [facility] for recheck of his abscess until he is seen by dentist, as well as for assessment of his HIV disease. The patient acknowledges understanding of the instructions. Prescription for clindamycin, ibuprofen, and Ultram given to the patient.    Final diagnoses:  Dental abscess  HIV infection (HCC)    New Prescriptions New Prescriptions   CLINDAMYCIN (CLEOCIN) 300 MG CAPSULE    Take 1 capsule (300 mg total) by mouth 3 (three) times daily. Take medication with food   IBUPROFEN (ADVIL,MOTRIN) 600 MG TABLET    Take 1 tablet (600 mg total) by mouth 4 (four) times daily.   TRAMADOL  (ULTRAM) 50 MG TABLET    1 or 2 po q6h prn pain     Ivery Quale, PA-C 06/16/16 2125    Ivery Quale, PA-C 06/16/16 2127    Donnetta Hutching, MD 06/17/16 [phone]

## 2016-06-16 NOTE — Discharge Instructions (Signed)
Your temperature is 99.7, otherwise your vital signs are within normal limits. Your examination reveals an abscess involving teeth on the right side of your lower jaw. Please use clindamycin 3 times daily with food. Please use ibuprofen 4 times daily with food for swelling and inflammation and pain. May use Ultram for more severe pain if needed. It is important that you see a member of the [facility] as soon as possible for follow-up of your HIV and for follow-up of your abscess to your seen by dentist. Please see a dentist as soon as possible.

## 2016-06-16 NOTE — ED Triage Notes (Signed)
Swelling right jaw

## 2021-01-12 ENCOUNTER — Other Ambulatory Visit: Payer: Self-pay

## 2021-01-12 ENCOUNTER — Encounter: Payer: Self-pay | Admitting: Internal Medicine

## 2021-01-12 DIAGNOSIS — B20 Human immunodeficiency virus [HIV] disease: Secondary | ICD-10-CM

## 2021-01-12 DIAGNOSIS — Z79899 Other long term (current) drug therapy: Secondary | ICD-10-CM

## 2021-01-12 DIAGNOSIS — Z114 Encounter for screening for human immunodeficiency virus [HIV]: Secondary | ICD-10-CM

## 2021-01-12 DIAGNOSIS — Z113 Encounter for screening for infections with a predominantly sexual mode of transmission: Secondary | ICD-10-CM

## 2021-01-12 LAB — URINALYSIS
Bilirubin Urine: NEGATIVE
Glucose, UA: NEGATIVE
Hgb urine dipstick: NEGATIVE
Ketones, ur: NEGATIVE
Leukocytes,Ua: NEGATIVE
Nitrite: NEGATIVE
Protein, ur: NEGATIVE
Specific Gravity, Urine: 1.021 (ref 1.001–1.035)
pH: 5.5 (ref 5.0–8.0)

## 2021-01-13 LAB — URINE CYTOLOGY ANCILLARY ONLY
Chlamydia: NEGATIVE
Comment: NEGATIVE
Comment: NORMAL
Neisseria Gonorrhea: NEGATIVE

## 2021-01-14 ENCOUNTER — Encounter (HOSPITAL_COMMUNITY): Payer: Self-pay | Admitting: *Deleted

## 2021-01-14 ENCOUNTER — Other Ambulatory Visit: Payer: Self-pay

## 2021-01-14 ENCOUNTER — Emergency Department (HOSPITAL_COMMUNITY)
Admission: EM | Admit: 2021-01-14 | Discharge: 2021-01-14 | Disposition: A | Discharge status: Home / Self Care | Payer: Self-pay | Attending: Emergency Medicine | Admitting: Emergency Medicine

## 2021-01-14 DIAGNOSIS — H6122 Impacted cerumen, left ear: Secondary | ICD-10-CM | POA: Insufficient documentation

## 2021-01-14 DIAGNOSIS — Z21 Asymptomatic human immunodeficiency virus [HIV] infection status: Secondary | ICD-10-CM | POA: Insufficient documentation

## 2021-01-14 DIAGNOSIS — F17200 Nicotine dependence, unspecified, uncomplicated: Secondary | ICD-10-CM | POA: Insufficient documentation

## 2021-01-14 DIAGNOSIS — Z79899 Other long term (current) drug therapy: Secondary | ICD-10-CM | POA: Insufficient documentation

## 2021-01-14 LAB — T-HELPER CELL (CD4) - (RCID CLINIC ONLY)
CD4 % Helper T Cell: 39 % (ref 33–65)
CD4 T Cell Abs: 805 /uL (ref 400–1790)

## 2021-01-14 MED ORDER — HYDROGEN PEROXIDE 3 % EX SOLN
CUTANEOUS | Status: AC
  Filled 2021-01-14: qty 473

## 2021-01-14 NOTE — ED Triage Notes (Signed)
Pt with muffled sounds and pressure to left ear for several weeks.

## 2021-01-14 NOTE — ED Provider Notes (Signed)
[facility] EMERGENCY DEPARTMENT Provider Note   CSN: [id number] Arrival date & time: 01/14/21  1836     History Chief Complaint  Patient presents with   Taylor Mcguire is a 40 y.o. male. Patient is with 1 month of decreased hearing and feeling like his left ear is clogged.  He tried the otic drops with no relief of symptoms.  He denies any ear pain, drainage, fevers, chills.  He denies any headaches, nasal congestion, sore throat.  Otalgia Associated symptoms: hearing loss   Associated symptoms: no congestion, no ear discharge, no fever and no sore throat       History reviewed. No pertinent past medical history.  Patient Active Problem List   Diagnosis Date Noted   HIV INFECTION 07/02/2008    History reviewed. No pertinent surgical history.     History reviewed. No pertinent family history.  Social History   Tobacco Use   Smoking status: Every Day   Smokeless tobacco: Never  Substance Use Topics   Alcohol use: Yes    Comment: occ   Drug use: No    Home Medications Prior to Admission medications   Medication Sig Start Date End Date Taking? Authorizing Provider  clindamycin (CLEOCIN) 300 MG capsule Take 1 capsule (300 mg total) by mouth 3 (three) times daily. Take medication with food 06/16/16   Ivery Quale, PA-C  HYDROcodone-acetaminophen (NORCO/VICODIN) 5-325 MG per tablet Take one-two tabs po q 4-6 hrs prn pain 12/07/13   Triplett, Tammy, PA-C  ibuprofen (ADVIL,MOTRIN) 600 MG tablet Take 1 tablet (600 mg total) by mouth 4 (four) times daily. 06/16/16   Ivery Quale, PA-C  traMADol Janean Sark) 50 MG tablet 1 or 2 po q6h prn pain 06/16/16   Ivery Quale, PA-C    Allergies    Patient has no known allergies.  Review of Systems   Review of Systems  Constitutional:  Negative for fever.  HENT:  Positive for hearing loss. Negative for congestion, ear discharge, ear pain and sore throat.   All other systems reviewed and are negative.  Physical  Exam Updated Vital Signs BP 122/84 (BP Location: Right Arm)    Pulse 68    Temp 97.8 F (36.6 C) (Oral)    Resp 17    Ht 5\' 11"  (1.803 m)    Wt 68 kg    SpO2 99%    BMI 20.92 kg/m   Physical Exam Vitals and nursing note reviewed.  Constitutional:      General: He is not in acute distress.    Appearance: Normal appearance. He is well-developed. He is not ill-appearing, toxic-appearing or diaphoretic.  HENT:     Head: Normocephalic and atraumatic.     Right Ear: Tympanic membrane normal.     Left Ear: Decreased hearing noted. There is impacted cerumen.     Nose: No nasal deformity.     Mouth/Throat:     Lips: Pink. No lesions.  Eyes:     General: Gaze aligned appropriately. No scleral icterus.       Right eye: No discharge.        Left eye: No discharge.     Conjunctiva/sclera: Conjunctivae normal.     Right eye: Right conjunctiva is not injected. No exudate or hemorrhage.    Left eye: Left conjunctiva is not injected. No exudate or hemorrhage. Pulmonary:     Effort: Pulmonary effort is normal. No respiratory distress.  Skin:    General: Skin is warm and  dry.  Neurological:     Mental Status: He is alert and oriented to person, place, and time.  Psychiatric:        Mood and Affect: Mood normal.        Speech: Speech normal.        Behavior: Behavior normal. Behavior is cooperative.    ED Results / Procedures / Treatments   Labs (all labs ordered are listed, but only abnormal results are displayed) Labs Reviewed - No data to display  EKG None  Radiology No results found.  Procedures Procedures   Medications Ordered in ED Medications  hydrogen peroxide 3 % external solution (  Given 01/14/21 1919)    ED Course  I have reviewed the triage vital signs and the nursing notes.  Pertinent labs & imaging results that were available during my care of the patient were reviewed by me and considered in my medical decision making (see chart for details).    MDM  Rules/Calculators/A&P                          Patient with left ear muffling sounds and decreased hearing for 1 month. No other concerning symptoms such as ear pain, congestion, sore throat, fevers, or chills.  Physical exam reveals obstruction of left ear.  Cannot visualize TM.  Will clean out left ear.  Right TM is visible with no obstruction.  Point earwax removal, patient states he is able to hear normally again.  Reevaluated TM and does have some erythema likely due to the cleanout process.  Wax was completely removed.  Patient able to be discharged home.  Final Clinical Impression(s) / ED Diagnoses Final diagnoses:  Impacted cerumen of left ear    Rx / DC Orders ED Discharge Orders     None        Claudie Leach, PA-C 01/14/21 2208    Maia Plan, MD 01/18/21 [phone]

## 2021-01-14 NOTE — ED Notes (Signed)
Pt states "much better" and feels like they can hear better

## 2021-01-20 ENCOUNTER — Other Ambulatory Visit (HOSPITAL_COMMUNITY): Payer: Self-pay

## 2021-01-25 ENCOUNTER — Ambulatory Visit (INDEPENDENT_AMBULATORY_CARE_PROVIDER_SITE_OTHER): Payer: Self-pay | Admitting: Infectious Diseases

## 2021-01-25 ENCOUNTER — Encounter: Payer: Self-pay | Admitting: Infectious Diseases

## 2021-01-25 ENCOUNTER — Ambulatory Visit: Payer: Self-pay

## 2021-01-25 ENCOUNTER — Other Ambulatory Visit: Payer: Self-pay

## 2021-01-25 ENCOUNTER — Encounter: Payer: BLUE CROSS/BLUE SHIELD | Admitting: Internal Medicine

## 2021-01-25 VITALS — BP 145/86 | HR 67 | Temp 97.4°F | Wt 164.0 lb

## 2021-01-25 DIAGNOSIS — Z7185 Encounter for immunization safety counseling: Secondary | ICD-10-CM

## 2021-01-25 DIAGNOSIS — B20 Human immunodeficiency virus [HIV] disease: Secondary | ICD-10-CM

## 2021-01-25 DIAGNOSIS — A539 Syphilis, unspecified: Secondary | ICD-10-CM

## 2021-01-25 DIAGNOSIS — Z113 Encounter for screening for infections with a predominantly sexual mode of transmission: Secondary | ICD-10-CM | POA: Insufficient documentation

## 2021-01-25 DIAGNOSIS — F17200 Nicotine dependence, unspecified, uncomplicated: Secondary | ICD-10-CM

## 2021-01-25 DIAGNOSIS — Z720 Tobacco use: Secondary | ICD-10-CM | POA: Insufficient documentation

## 2021-01-25 MED ORDER — PENICILLIN G BENZATHINE 1200000 UNIT/2ML IM SUSY
1.2000 10*6.[IU] | PREFILLED_SYRINGE | Freq: Once | INTRAMUSCULAR | Status: AC
  Administered 2021-01-25: 1.2 10*6.[IU] via INTRAMUSCULAR

## 2021-01-25 NOTE — Progress Notes (Addendum)
[address], [place], [place], [id number]                                                                  Phn. [phone]; Fax: [phone]                                                                             Date: 01/26/20  Reason for Visit: HIV Rapid start  Primary Care provider: None    HPI: Taylor Mcguire is a 41 y.o.old male with a history of HIV here for initial visit for HIV.   He was diagnosed with HIV at the age of 41 when he was tested because of carbuncles in his left arm. He was undetectable at that time. He denies being sexually active at the time of diagnosis. He was tested several times periodically and remained undetectable and was told he did not need treatment. He has not received any treatment so far and tells me he has always remained undetectable. Denies any h/o Ois, STDS, hospitalizations. Denies being sexually active recently, Last sexual encounter was 3 months ago and was with a protection. He perfers male sexual partners. He does not prefer receptive intercourse because of the pain unless he loves the person dearly.   Denies any other medical conditions, surgeries and denies being on any medications currently.  He works as a Secretary/administratorcover operator Smokes 4-5 cigarettes a day, marijuana +, alcohol socially Lives alone  HIV H/o  05/07/2005 - HIV EIA positive, HIV western blot positive   ROS: Denies dysphagia, odynophagia, cough, fever, nausea, vomiting, diarrhea, constipation, weight loss, chills, night sweats, recent hospitalizations, rashes, joint complaints, shortness of breath, headaches, chest pain, abdominal pain, dysuria .  No current outpatient medications on file prior to visit.   No current facility-administered medications on file prior to visit.                      No Known Allergies  PMH - none PSH - none  Social History   Socioeconomic History   Marital status: Single    Spouse name: Not on file   Number of children: Not on file   Years of education: Not on file   Highest education level: Not on file  Occupational History   Not on file  Tobacco Use   Smoking status: Every Day   Smokeless tobacco: Never  Substance and Sexual Activity   Alcohol use: Yes    Comment: occ   Drug use: Yes    Types: Marijuana   Sexual activity: Yes    Partners: Male    Birth  control/protection: None    Comment: accepted condoms  Other Topics Concern   Not on file  Social History Narrative   Not on file   Social Determinants of Health   Financial Resource Strain: Not on file  Food Insecurity: Not on file  Transportation Needs: Not on file  Physical Activity: Not on file  Stress: Not on file  Social Connections: Not on file  Intimate Partner Violence: Not on file   No family history on file.  Vitals  BP (!) 145/86    Pulse 67    Temp (!) 97.4 F (36.3 C) (Oral)    Wt 164 lb (74.4 kg)    BMI 22.87 kg/m   Examination  Gen: Alert and oriented x 3, no acute distress HEENT: [place]/AT, no scleral icterus, no pale conjunctivae, hearing normal, oral mucosa moist Neck: Supple Cardio: Regular rate and rhythm; +S1 and S2 Resp: CTAB GI: Soft, nontender, nondistended GU: Musc: Extremities: No cyanosis, clubbing, or edema Skin: No rashes, lesions, or ecchymoses Neuro: grossly non focal  Psych: Calm, cooperative  Lab Results HIV 1 RNA Quant (copies/mL)  Date Value  01/12/2021 NOT DETECTED  11/25/2009 <20 copies/mL  06/10/2009 <48 copies/mL   CD4 T Cell Abs  Date Value  01/12/2021 805 /uL  11/25/2009 750 cmm  06/10/2009 1240 cmm   No results found for: HIV1GENOSEQ Lab Results  Component Value Date   WBC 6.2 01/12/2021   HGB 14.2 01/12/2021   HCT 46.1 01/12/2021   MCV 73.4 (L) 01/12/2021   PLT 245 01/12/2021    Lab Results   Component Value Date   CREATININE 1.08 01/12/2021   BUN 17 01/12/2021   NA 140 01/12/2021   K 4.1 01/12/2021   CL 104 01/12/2021   CO2 30 01/12/2021   Lab Results  Component Value Date   ALT 39 01/12/2021   AST 24 01/12/2021   ALKPHOS 50 11/25/2009   BILITOT 1.4 (H) 01/12/2021    Lab Results  Component Value Date   CHOL 178 01/12/2021   TRIG 126 01/12/2021   HDL 52 01/12/2021   LDLCALC 103 (H) 01/12/2021   Lab Results  Component Value Date   HAV REACTIVE (A) 01/12/2021   Lab Results  Component Value Date   HEPBSAG NON-REACTIVE 01/12/2021   HEPBSAB REACTIVE (A) 01/12/2021   Lab Results  Component Value Date   HCVAB NEG 06/10/2009   Lab Results  Component Value Date   CHLAMYDIAWP Negative 01/12/2021   N Negative 01/12/2021   No results found for: GCPROBEAPT No results found for: QUANTGOLD    Health Maintenance: Immunization History  Administered Date(s) Administered   Hepatitis A 07/24/2005, 06/09/2008   Hepatitis B 11/21/1993, 12/22/1993, 05/24/1994   OPV 10/08/1987   Td 06/09/2008   Problem List Items Addressed This Visit       Other   HIV INFECTION - Primary   Screening for STDs (sexually transmitted diseases)   Syphilis   Immunization counseling   Smoking    Assessment/Plan: HIV ( Possibly [employer]) Lab Results  Component Value Date   CD4TABS 805 01/12/2021   CD4TABS 750 11/25/2009   CD4TABS 1240 06/10/2009    Discussed with patient at length about although he is undetectable, ART is preferable to reduce  immune activation/inflammation, decrease in CD4 cells, non infectious HIV/AIDS complications like cardiovascular diseases including malignancy. However, he is not interested in treatment at this time.  Discussed with patient about newer treatment options, benefits of treatment, long term outcomes Discussed  needing to use condoms, partner disclosure, necessary vaccines, blood monitoring.   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   STD Screening  Recently screened  Urine GC negative and positive for syphilis as above   Smoking Counseled   #Immunization ( declined all vaccines) COVID - declines  Influenza - declines Monkeypox - declines  Pneumococcal - declines  HepA/HEpB- Immune   #Health maintenance -Quantiferon negative  -Dental visit - interested to see dentist, will refer to dental clinic  Patient's labs were reviewed as well as his previous records. Patients questions were addressed and answered. Safe sex counseling done.  I have personally spent 60 minutes involved in face-to-face and non-face-to-face activities for this patient on the day of the visit. Professional time spent includes the following activities: Preparing to see the patient (review of tests), Obtaining and/or reviewing separately obtained history (admission/discharge record), Performing a medically appropriate examination and/or evaluation , Ordering medications/tests/procedures, referring and communicating with other health care professionals, Documenting clinical information in the EMR, Independently interpreting results (not separately reported), Communicating results to the patient/family/caregiver, Counseling and educating the patient/family/caregiver and Care coordination (not separately reported).    Electronically signed by:  Odette Fraction, MD Infectious Disease Physician [facility] [address] [place], [place] [address] Phone: [phone]   Fax: [phone]

## 2021-01-28 LAB — COMPLETE METABOLIC PANEL WITH GFR
AG Ratio: 2.1 (calc) (ref 1.0–2.5)
ALT: 39 U/L (ref 9–46)
AST: 24 U/L (ref 10–40)
Albumin: 4.7 g/dL (ref 3.6–5.1)
Alkaline phosphatase (APISO): 55 U/L (ref 36–130)
BUN: 17 mg/dL (ref 7–25)
CO2: 30 mmol/L (ref 20–32)
Calcium: 9.7 mg/dL (ref 8.6–10.3)
Chloride: 104 mmol/L (ref 98–110)
Creat: 1.08 mg/dL (ref 0.60–1.26)
Globulin: 2.2 g/dL (calc) (ref 1.9–3.7)
Glucose, Bld: 102 mg/dL — ABNORMAL HIGH (ref 65–99)
Potassium: 4.1 mmol/L (ref 3.5–5.3)
Sodium: 140 mmol/L (ref 135–146)
Total Bilirubin: 1.4 mg/dL — ABNORMAL HIGH (ref 0.2–1.2)
Total Protein: 6.9 g/dL (ref 6.1–8.1)
eGFR: 90 mL/min/{1.73_m2} (ref >=60)

## 2021-01-28 LAB — CBC WITH DIFFERENTIAL/PLATELET
Absolute Monocytes: 446 cells/uL (ref 200–950)
Basophils Absolute: 62 cells/uL (ref 0–200)
Basophils Relative: 1 %
Eosinophils Absolute: 81 cells/uL (ref 15–500)
Eosinophils Relative: 1.3 %
HCT: 46.1 % (ref 38.5–50.0)
Hemoglobin: 14.2 g/dL (ref 13.2–17.1)
Lymphs Abs: 2139 cells/uL (ref 850–3900)
MCH: 22.6 pg — ABNORMAL LOW (ref 27.0–33.0)
MCHC: 30.8 g/dL — ABNORMAL LOW (ref 32.0–36.0)
MCV: 73.4 fL — ABNORMAL LOW (ref 80.0–100.0)
MPV: 11.3 fL (ref 7.5–12.5)
Monocytes Relative: 7.2 %
Neutro Abs: 3472 cells/uL (ref 1500–7800)
Neutrophils Relative %: 56 %
Platelets: 245 10*3/uL (ref 140–400)
RBC: 6.28 10*6/uL — ABNORMAL HIGH (ref 4.20–5.80)
RDW: 14.3 % (ref 11.0–15.0)
Total Lymphocyte: 34.5 %
WBC: 6.2 10*3/uL (ref 3.8–10.8)

## 2021-01-28 LAB — QUANTIFERON-TB GOLD PLUS
Mitogen-NIL: >10 IU/mL
NIL: 0.03 IU/mL
QuantiFERON-TB Gold Plus: NEGATIVE
TB1-NIL: 0 IU/mL
TB2-NIL: <0 IU/mL

## 2021-01-28 LAB — HEPATITIS C ANTIBODY
Hepatitis C Ab: NONREACTIVE
SIGNAL TO CUT-OFF: 0.05 (ref <1.00)

## 2021-01-28 LAB — HEPATITIS B SURFACE ANTIGEN: Hepatitis B Surface Ag: NONREACTIVE

## 2021-01-28 LAB — LIPID PANEL
Cholesterol: 178 mg/dL (ref <200)
HDL: 52 mg/dL (ref >=40)
LDL Cholesterol (Calc): 103 mg/dL (calc) — ABNORMAL HIGH
Non-HDL Cholesterol (Calc): 126 mg/dL (calc) (ref <130)
Total CHOL/HDL Ratio: 3.4 (calc) (ref <5.0)
Triglycerides: 126 mg/dL (ref <150)

## 2021-01-28 LAB — HEPATITIS B CORE ANTIBODY, TOTAL: Hep B Core Total Ab: NONREACTIVE

## 2021-01-28 LAB — HIV-1 RNA ULTRAQUANT REFLEX TO GENTYP+
HIV 1 RNA Quant: NOT DETECTED copies/mL
HIV-1 RNA Quant, Log: NOT DETECTED Log copies/mL

## 2021-01-28 LAB — RPR: RPR Ser Ql: REACTIVE — AB

## 2021-01-28 LAB — HIV-1/2 AB - DIFFERENTIATION
HIV-1 antibody: POSITIVE — AB
HIV-2 Ab: NEGATIVE

## 2021-01-28 LAB — HEPATITIS A ANTIBODY, TOTAL: Hepatitis A AB,Total: REACTIVE — AB

## 2021-01-28 LAB — RPR TITER: RPR Titer: 1:8 {titer} — ABNORMAL HIGH

## 2021-01-28 LAB — HEPATITIS B SURFACE ANTIBODY,QUALITATIVE: Hep B S Ab: REACTIVE — AB

## 2021-01-28 LAB — HLA B*5701: HLA-B*5701 w/rflx HLA-B High: NEGATIVE

## 2021-01-28 LAB — FLUORESCENT TREPONEMAL AB(FTA)-IGG-BLD: Fluorescent Treponemal ABS: REACTIVE — AB

## 2021-01-28 LAB — HIV ANTIBODY (ROUTINE TESTING W REFLEX): HIV 1&2 Ab, 4th Generation: REACTIVE — AB

## 2021-02-01 ENCOUNTER — Other Ambulatory Visit: Payer: Self-pay

## 2021-02-01 ENCOUNTER — Ambulatory Visit (INDEPENDENT_AMBULATORY_CARE_PROVIDER_SITE_OTHER): Payer: Self-pay

## 2021-02-01 DIAGNOSIS — A539 Syphilis, unspecified: Secondary | ICD-10-CM

## 2021-02-01 MED ORDER — PENICILLIN G BENZATHINE 1200000 UNIT/2ML IM SUSY
1.2000 10*6.[IU] | PREFILLED_SYRINGE | Freq: Once | INTRAMUSCULAR | Status: AC
  Administered 2021-02-01: 1.2 10*6.[IU] via INTRAMUSCULAR

## 2021-02-08 ENCOUNTER — Other Ambulatory Visit: Payer: Self-pay

## 2021-02-08 ENCOUNTER — Ambulatory Visit (INDEPENDENT_AMBULATORY_CARE_PROVIDER_SITE_OTHER): Payer: Self-pay

## 2021-02-08 DIAGNOSIS — A539 Syphilis, unspecified: Secondary | ICD-10-CM

## 2021-02-08 MED ORDER — PENICILLIN G BENZATHINE 1200000 UNIT/2ML IM SUSY
1.2000 10*6.[IU] | PREFILLED_SYRINGE | Freq: Once | INTRAMUSCULAR | Status: AC
  Administered 2021-02-08: 1.2 10*6.[IU] via INTRAMUSCULAR

## 2021-04-26 ENCOUNTER — Ambulatory Visit: Payer: Self-pay | Admitting: Infectious Diseases

## 2023-05-29 ENCOUNTER — Emergency Department (HOSPITAL_COMMUNITY)

## 2023-05-29 ENCOUNTER — Other Ambulatory Visit: Payer: Self-pay

## 2023-05-29 ENCOUNTER — Encounter (HOSPITAL_COMMUNITY): Payer: Self-pay

## 2023-05-29 ENCOUNTER — Emergency Department (HOSPITAL_COMMUNITY)
Admission: EM | Admit: 2023-05-29 | Discharge: 2023-05-29 | Disposition: A | Discharge status: Home / Self Care | Attending: Emergency Medicine | Admitting: Emergency Medicine

## 2023-05-29 DIAGNOSIS — K8020 Calculus of gallbladder without cholecystitis without obstruction: Secondary | ICD-10-CM | POA: Diagnosis not present

## 2023-05-29 DIAGNOSIS — Z951 Presence of aortocoronary bypass graft: Secondary | ICD-10-CM | POA: Insufficient documentation

## 2023-05-29 DIAGNOSIS — N451 Epididymitis: Secondary | ICD-10-CM

## 2023-05-29 DIAGNOSIS — Z7984 Long term (current) use of oral hypoglycemic drugs: Secondary | ICD-10-CM | POA: Diagnosis not present

## 2023-05-29 DIAGNOSIS — E119 Type 2 diabetes mellitus without complications: Secondary | ICD-10-CM | POA: Insufficient documentation

## 2023-05-29 DIAGNOSIS — R748 Abnormal levels of other serum enzymes: Secondary | ICD-10-CM | POA: Insufficient documentation

## 2023-05-29 DIAGNOSIS — Z7982 Long term (current) use of aspirin: Secondary | ICD-10-CM | POA: Insufficient documentation

## 2023-05-29 DIAGNOSIS — Z7902 Long term (current) use of antithrombotics/antiplatelets: Secondary | ICD-10-CM | POA: Insufficient documentation

## 2023-05-29 DIAGNOSIS — R1010 Upper abdominal pain, unspecified: Secondary | ICD-10-CM | POA: Diagnosis present

## 2023-05-29 DIAGNOSIS — I10 Essential (primary) hypertension: Secondary | ICD-10-CM | POA: Insufficient documentation

## 2023-05-29 LAB — URINALYSIS, ROUTINE W REFLEX MICROSCOPIC
Bacteria, UA: NONE SEEN
Bilirubin Urine: NEGATIVE
Glucose, UA: NEGATIVE mg/dL
Hgb urine dipstick: NEGATIVE
Ketones, ur: NEGATIVE mg/dL
Leukocytes,Ua: NEGATIVE
Nitrite: NEGATIVE
Protein, ur: 30 mg/dL — AB
Specific Gravity, Urine: 1.02 (ref 1.005–1.030)
pH: 7 (ref 5.0–8.0)

## 2023-05-29 MED ORDER — DOXYCYCLINE HYCLATE 100 MG PO TABS
100.0000 mg | ORAL_TABLET | Freq: Once | ORAL | Status: AC
  Administered 2023-05-29: 100 mg via ORAL
  Filled 2023-05-29: qty 1

## 2023-05-29 MED ORDER — CEFTRIAXONE SODIUM 500 MG IJ SOLR
500.0000 mg | Freq: Once | INTRAMUSCULAR | Status: AC
  Administered 2023-05-29: 500 mg via INTRAMUSCULAR
  Filled 2023-05-29: qty 500

## 2023-05-29 MED ORDER — IBUPROFEN 400 MG PO TABS
400.0000 mg | ORAL_TABLET | Freq: Once | ORAL | Status: AC
  Administered 2023-05-29: 400 mg via ORAL
  Filled 2023-05-29: qty 1

## 2023-05-29 MED ORDER — LEVOFLOXACIN 500 MG PO TABS: 500.0000 mg | ORAL_TABLET | Freq: Every day | ORAL | 0 refills | Status: AC

## 2023-05-29 MED ORDER — LIDOCAINE HCL (PF) 1 % IJ SOLN
INTRAMUSCULAR | Status: AC
  Administered 2023-05-29: 1 mL
  Filled 2023-05-29: qty 2

## 2023-05-29 NOTE — ED Notes (Signed)
 ED Provider at bedside.

## 2023-05-29 NOTE — ED Provider Notes (Signed)
 [place] EMERGENCY DEPARTMENT AT [facility] Provider Note   CSN: [id number] Arrival date & time: 05/29/23  1017     History  Chief Complaint  Patient presents with   Testicle Pain    Taylor Mcguire is a 43 y.o. male.  The history is provided by the patient.  Patient with history of HIV and syphilis presents with right testicle pain Patient reports starting yesterday he had a sudden onset of right testicle pain.  Denies trauma. Denies any recent sexual contact.  No dysuria or penile discharge.  He has never had this before. He has no other complaints      Home Medications Prior to Admission medications   Medication Sig Start Date End Date Taking? Authorizing Provider  levofloxacin (LEVAQUIN) 500 MG tablet Take 1 tablet (500 mg total) by mouth daily. 05/29/23  Yes Eldon Greenland, MD      Allergies    Patient has no known allergies.    Review of Systems   Review of Systems  Constitutional:  Negative for fever.  Gastrointestinal:  Negative for vomiting.  Genitourinary:  Positive for testicular pain. Negative for dysuria and penile discharge.    Physical Exam Updated Vital Signs BP 118/78   Pulse 75   Temp 98.3 F (36.8 C) (Oral)   Resp 17   Ht 1.803 m (5\' 11" )   Wt 75 kg   SpO2 100%   BMI 23.06 kg/m  Physical Exam CONSTITUTIONAL: Well developed/well nourished HEAD: Normocephalic/atraumatic ABDOMEN: soft GU: GU exam performed with nurse Dee Farber present.  Patient with swelling and tenderness to the right testicle No overlying erythema or crepitus.  No tenderness in the perineum Testicle is nontender.  No penile lesions or discharge.  There is no inguinal hernia There is no inguinal lymphadenopathy NEURO: Pt is awake/alert/appropriate, moves all extremitiesx4.  No facial droop.   EXTREMITIES: pulses normal/equal  SKIN: warm, color normal PSYCH:anxious  ED Results / Procedures / Treatments   Labs (all labs ordered are listed, but only abnormal  results are displayed) Labs Reviewed  URINALYSIS, ROUTINE W REFLEX MICROSCOPIC - Abnormal; Notable for the following components:      Result Value   Protein, ur 30 (*)    All other components within normal limits  GC/CHLAMYDIA PROBE AMP ([place]) NOT AT [facility]    EKG None  Radiology US  SCROTUM W/DOPPLER Result Date: 05/29/2023 CLINICAL DATA:  Right testicular pain for 1 week. EXAM: SCROTAL ULTRASOUND DOPPLER ULTRASOUND OF THE TESTICLES TECHNIQUE: Complete ultrasound examination of the testicles, epididymis, and other scrotal structures was performed. Color and spectral Doppler ultrasound were also utilized to evaluate blood flow to the testicles. COMPARISON:  None Available. FINDINGS: Right testicle Measurements: 5.0 x 3.7 x 2.4 cm. No mass or microlithiasis visualized. Left testicle Measurements: 4.5 x 3.1 x 2.1 cm. No mass or microlithiasis visualized. Right epididymis: Enlarged and hypervascular suggesting epididymitis. Left epididymis:  Normal in size and appearance. Hydrocele:  None visualized. Varicocele:  None visualized. Pulsed Doppler interrogation of both testes demonstrates normal low resistance arterial and venous waveforms bilaterally. IMPRESSION: No evidence of testicular mass or torsion. Enlarged and hypervascular right epididymis is noted suggesting epididymitis. Electronically Signed   By: Rosalene Colon M.D.   On: 05/29/2023 13:02    Procedures Procedures    Medications Ordered in ED Medications  ibuprofen  (ADVIL ) tablet 400 mg (400 mg Oral Given 05/29/23 1108)  cefTRIAXone (ROCEPHIN) injection 500 mg (500 mg Intramuscular Given 05/29/23 1352)  doxycycline (VIBRA-TABS)  tablet 100 mg (100 mg Oral Given 05/29/23 1352)  lidocaine (PF) (XYLOCAINE) 1 % injection (1 mL  Given 05/29/23 1354)    ED Course/ Medical Decision Making/ A&P Clinical Course as of 05/29/23 1415  Tue May 29, 2023  1414 Patient presents with right testicle swelling for over the past day with increasing  pain.  Torsion has been ruled out.  Patient found to have epididymitis.  Patient reports he has not had any sexual contact in a year, and last sexual activity was oral sex.  Per records, patient has a history of HIV and syphilis.  Per previous infectious disease, patient prefers insertive anal intercourse.  Per guidelines, patient would benefit from Rocephin and Levaquin for 10 days and referral to urology [DW]    Clinical Course User Index [DW] Eldon Greenland, MD                                 Medical Decision Making Amount and/or Complexity of Data Reviewed Labs: ordered. Radiology: ordered.  Risk Prescription drug management.   This patient presents to the ED for concern of testicle pain, this involves an extensive number of treatment options, and is a complaint that carries with it a high risk of complications and morbidity.  The differential diagnosis includes but is not limited to testicular torsion, orchitis, epididymitis, incarcerated inguinal hernia  Comorbidities that complicate the patient evaluation: Patient's presentation is complicated by their history of HIV  Social Determinants of Health: Patient's impaired access to primary care  increases the complexity of managing their presentation  Additional history obtained: Records reviewed  infectious disease notes reviewed  Imaging Studies ordered: I ordered imaging studies including Ultrasound scrotal ultrasound   I independently visualized and interpreted imaging which showed epididymitis I agree with the radiologist interpretation   Medicines ordered and prescription drug management: I ordered medication including ibuprofen  for pain Reevaluation of the patient after these medicines showed that the patient    improved  Reevaluation: After the interventions noted above, I reevaluated the patient and found that they have :improved  Complexity of problems addressed: Patient's presentation is most consistent with   acute complicated illness/injury requiring diagnostic workup  Disposition: After consideration of the diagnostic results and the patient's response to treatment,  I feel that the patent would benefit from discharge   .           Final Clinical Impression(s) / ED Diagnoses Final diagnoses:  Epididymitis    Rx / DC Orders ED Discharge Orders          Ordered    levofloxacin (LEVAQUIN) 500 MG tablet  Daily        05/29/23 1411              Eldon Greenland, MD 05/29/23 1416

## 2023-05-29 NOTE — ED Triage Notes (Signed)
 Pt arrived via POV c/o right testicular pain that began yesterday. Pt reports it is very tender to the touch and denies injury.

## 2023-10-05 ENCOUNTER — Encounter: Payer: Self-pay | Admitting: Family

## 2023-10-05 ENCOUNTER — Other Ambulatory Visit: Payer: Self-pay

## 2023-10-05 ENCOUNTER — Ambulatory Visit: Admitting: Family

## 2023-10-05 ENCOUNTER — Other Ambulatory Visit: Payer: Self-pay | Admitting: Pharmacist

## 2023-10-05 VITALS — BP 128/85 | HR 60 | Temp 97.6°F | Ht 70.5 in | Wt 166.0 lb

## 2023-10-05 DIAGNOSIS — Z79899 Other long term (current) drug therapy: Secondary | ICD-10-CM | POA: Diagnosis not present

## 2023-10-05 DIAGNOSIS — B20 Human immunodeficiency virus [HIV] disease: Secondary | ICD-10-CM

## 2023-10-05 DIAGNOSIS — Z0000 Encounter for general adult medical examination without abnormal findings: Secondary | ICD-10-CM

## 2023-10-05 DIAGNOSIS — Z9189 Other specified personal risk factors, not elsewhere classified: Secondary | ICD-10-CM

## 2023-10-05 DIAGNOSIS — Z113 Encounter for screening for infections with a predominantly sexual mode of transmission: Secondary | ICD-10-CM | POA: Diagnosis not present

## 2023-10-05 DIAGNOSIS — F17210 Nicotine dependence, cigarettes, uncomplicated: Secondary | ICD-10-CM

## 2023-10-05 DIAGNOSIS — A539 Syphilis, unspecified: Secondary | ICD-10-CM | POA: Diagnosis not present

## 2023-10-05 DIAGNOSIS — Z720 Tobacco use: Secondary | ICD-10-CM

## 2023-10-05 MED ORDER — BICTEGRAVIR-EMTRICITAB-TENOFOV 50-200-25 MG PO TABS: 1.0000 | ORAL_TABLET | Freq: Every day | ORAL | Status: AC

## 2023-10-05 MED ORDER — BIKTARVY 50-200-25 MG PO TABS: 1.0000 | ORAL_TABLET | Freq: Every day | ORAL | 5 refills | Status: AC

## 2023-10-05 NOTE — Assessment & Plan Note (Signed)
 Taylor Mcguire continues to smoke and is working on cutting back and eventually quitting. Counseled on the dangers of tobacco not ready to quit at this time.  Reviewed strategies to maximize success, including removing cigarettes and smoking materials from environment, stress management, substitution of other forms of reinforcement, support of family/friends, and written materials.

## 2023-10-05 NOTE — Assessment & Plan Note (Signed)
 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 Check RPR for confirmation of cure and continued monitoring.

## 2023-10-05 NOTE — Progress Notes (Signed)
 Medication Samples have been provided to the patient.  Drug name: Biktarvy         Strength: 50/200/25 mg       Qty: 7 tablets (1 bottles) LOT: CTGMDA   Exp.Date: 7/27  Samples requested by Marlan Silva, NP.  Dosing instructions: Take one tablet by mouth once daily  The patient has been instructed regarding the correct time, dose, and frequency of taking this medication, including desired effects and most common side effects.   Nicklas Barns, PharmD, CPP, BCIDP, AAHIVP Clinical Pharmacist Practitioner Infectious Diseases Clinical Pharmacist [facility] for Infectious Disease

## 2023-10-05 NOTE — Progress Notes (Signed)
 Brief Narrative   Patient ID: Taylor Mcguire, male    DOB: 11-10-1980, 43 y.o.   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 Initial viral load 53 and CD4 count 650. Entered care at [facility] Stage 1. No gentotype information available.. No history of opportunistic infection. No history of ART medication.   Subjective:   Chief Complaint  Patient presents with   Follow-up    B20    HPI:  Taylor Mcguire is a 43 y.o. male with HIV disease last seen on 01/25/21 by Dr. Dea with well controlled virus and not on ART consistent with elite controller status. ART was recommended and not interested at that time. Viral load was undetectable and CD4 count 805. Here today for follow up.   Taylor Mcguire has been doing well since his last office visit. Not currently on medication. Covered by [facility]. Interested in learning more about starting Biktarvy  with concerns tahat it may have an effect on his current elite status. Housing, transportation and access to food are stable. Healthcare maintenance reviewed. Condoms and site specific STD testing offered. Dental care up to date.   Denies fevers, chills, night sweats, headaches, changes in vision, neck pain/stiffness, nausea, diarrhea, vomiting, lesions or rashes.  Lab Results  Component Value Date   CD4TCELL 39 01/12/2021   CD4TABS 805 01/12/2021   Lab Results  Component Value Date   HIV1RNAQUANT NOT DETECTED 01/12/2021     No Known Allergies    Outpatient Medications Prior to Visit  Medication Sig Dispense Refill   levofloxacin  (LEVAQUIN ) 500 MG tablet Take 1 tablet (500 mg total) by mouth daily. 10 tablet 0   No facility-administered medications prior to visit.     Past Medical History:  Diagnosis Date   HIV disease (HCC)    Syphilis      History reviewed. No pertinent surgical history.   Review of Systems  Constitutional:  Negative for appetite change,  chills, fatigue, fever and unexpected weight change.  Eyes:  Negative for visual disturbance.  Respiratory:  Negative for cough, chest tightness, shortness of breath and wheezing.   Cardiovascular:  Negative for chest pain and leg swelling.  Gastrointestinal:  Negative for abdominal pain, constipation, diarrhea, nausea and vomiting.  Genitourinary:  Negative for dysuria, flank pain, frequency, genital sores, hematuria and urgency.  Skin:  Negative for rash.  Allergic/Immunologic: Negative for immunocompromised state.  Neurological:  Negative for dizziness and headaches.     Objective:   BP 128/85   Pulse 60   Temp 97.6 F (36.4 C) (Temporal)   Ht 5' 10.5 (1.791 m)   Wt 166 lb (75.3 kg)   SpO2 100%   BMI 23.48 kg/m  Nursing note and vital signs reviewed.  Physical Exam Constitutional:      General: He is not in acute distress.    Appearance: He is well-developed.  Eyes:     Conjunctiva/sclera: Conjunctivae normal.  Cardiovascular:     Rate and Rhythm: Normal rate and regular rhythm.     Heart sounds: Normal heart sounds. No murmur heard.    No friction rub. No gallop.  Pulmonary:     Effort: Pulmonary effort is normal. No respiratory distress.     Breath sounds: Normal breath sounds. No wheezing or rales.  Chest:     Chest wall: No tenderness.  Abdominal:     General: Bowel sounds are  normal.     Palpations: Abdomen is soft.     Tenderness: There is no abdominal tenderness.  Musculoskeletal:     Cervical back: Neck supple.  Lymphadenopathy:     Cervical: No cervical adenopathy.  Skin:    General: Skin is warm and dry.     Findings: No rash.  Neurological:     Mental Status: He is alert and oriented to person, place, and time.  Psychiatric:        Behavior: Behavior normal.        Thought Content: Thought content normal.        Judgment: Judgment normal.          10/05/2023    8:53 AM 01/25/2021    9:08 AM  Depression screen PHQ 2/9  Decreased Interest 0  0  Down, Depressed, Hopeless 0 0  PHQ - 2 Score 0 0  Altered sleeping 0   Tired, decreased energy 0   Change in appetite 0   Feeling bad or failure about yourself  0   Trouble concentrating 0   Moving slowly or fidgety/restless 0   Suicidal thoughts 0   PHQ-9 Score 0   Difficult doing work/chores Not difficult at all         10/05/2023    8:53 AM  GAD 7 : Generalized Anxiety Score  Nervous, Anxious, on Edge 0  Control/stop worrying 0  Worry too much - different things 0  Trouble relaxing 0  Restless 0  Easily annoyed or irritable 0  Afraid - awful might happen 0  Total GAD 7 Score 0  Anxiety Difficulty Not difficult at all     The 10-year ASCVD risk score (Arnett DK, et al., 2019) is: 5.4%   Values used to calculate the score:     Age: 36 years     Clincally relevant sex: Male     Is Non-Hispanic African American: Yes     Diabetic: No     Tobacco smoker: Yes     Systolic Blood Pressure: 128 mmHg     Is BP treated: No     HDL Cholesterol: 52 mg/dL     Total Cholesterol: 178 mg/dL      Assessment & Plan:    Patient Active Problem List   Diagnosis Date Noted   Healthcare maintenance 10/05/2023   At risk for cardiovascular event 10/05/2023   Screening for STDs (sexually transmitted diseases) 01/25/2021   Syphilis 01/25/2021   Immunization counseling 01/25/2021   Tobacco use 01/25/2021   HIV INFECTION 07/02/2008     Problem List Items Addressed This Visit       Other   HIV INFECTION - Primary   Mr. Taylor Mcguire continues to have well controlled virus as an elite controller and not currently on medication. No evidence of disease progression. Discussed elite controller status and recommendations for initiation of ART to help reduce immune activation and inflammation. Following discussion regarding risks, benefits, and side effects or medication decision was to proceed with starting Biktarvy . Social determinants of health reviewed with no interventions indicated. Check  blood work. Sample of Biktarvy  and copay care provided. Plan for follow up in 1 month or sooner if needed.       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 Check RPR for confirmation of cure and continued monitoring.       Relevant Medications   bictegravir-emtricitabine-tenofovir AF (BIKTARVY ) 50-200-25 MG TABS tablet   Tobacco use   Mr. Mclucas continues to smoke and is working on cutting back and eventually quitting. Counseled on the dangers of tobacco not ready to quit at this time.  Reviewed strategies to maximize success, including removing cigarettes and smoking materials from environment, stress management, substitution of other forms of reinforcement, support of family/friends, and written materials.        Healthcare maintenance   Discussed importance of safe sexual practice and condom use. Condoms and site specific STD testing offered.  Vaccinations reviewed and declined following counseling.  Due for anal pap smear for anal cancer screening to be discussed at next office visit.  Dental care up to date.       At risk for cardiovascular event   Mr. Empie has an ASCVD risk score of 5.4%. Will discuss REPRIEVE study and potential for statin medication at next office visit.        Other Visit Diagnoses       Pharmacologic therapy       Relevant Orders   Lipid panel        I am having Kelijah L. Oshields start on Biktarvy . I am also having him maintain his levofloxacin .   Meds ordered this encounter  Medications   bictegravir-emtricitabine-tenofovir AF (BIKTARVY ) 50-200-25 MG TABS tablet    Sig: Take 1  tablet by mouth daily.    Dispense:  30 tablet    Refill:  5    Supervising Provider:   SNIDER, CYNTHIA [phone]    Prescription Type::   Renewal     Follow-up: Return in about 1 month (around 11/04/2023). or sooner if needed.    Cathlyn July, MSN, FNP-C Nurse Practitioner [place] [facility] for Infectious Disease [facility] Main number: [phone]

## 2023-10-05 NOTE — Assessment & Plan Note (Signed)
 Mr. Fayette continues to have well controlled virus as an elite controller and not currently on medication. No evidence of disease progression. Discussed elite controller status and recommendations for initiation of ART to help reduce immune activation and inflammation. Following discussion regarding risks, benefits, and side effects or medication decision was to proceed with starting Biktarvy . Social determinants of health reviewed with no interventions indicated. Check blood work. Sample of Biktarvy  and copay care provided. Plan for follow up in 1 month or sooner if needed.

## 2023-10-05 NOTE — Assessment & Plan Note (Addendum)
 Mr. Lapage has an ASCVD risk score of 5.4%. Will discuss REPRIEVE study and potential for statin medication at next office visit.

## 2023-10-05 NOTE — Assessment & Plan Note (Signed)
 Discussed importance of safe sexual practice and condom use. Condoms and site specific STD testing offered.  Vaccinations reviewed and declined following counseling.  Due for anal pap smear for anal cancer screening to be discussed at next office visit.  Dental care up to date.

## 2023-10-05 NOTE — Patient Instructions (Addendum)
 Nice to see you.  We will check your lab work today.  Start taking medications.   Refills have been sent to the pharmacy.  Plan for follow up in 1 months or sooner if needed.   Have a great day and stay safe!  Smoking Cessation: QuitlineNC 1-800-QUIT-NOW (217)656-5316); Espaol: 1-855-Djelo-Ya (1-253-521-5538) http://carroll-[email or web address].[email or web address]

## 2023-10-09 ENCOUNTER — Ambulatory Visit: Payer: Self-pay | Admitting: Family

## 2023-10-09 LAB — T PALLIDUM AB: T Pallidum Abs: POSITIVE — AB

## 2023-10-09 LAB — COMPREHENSIVE METABOLIC PANEL WITH GFR
AG Ratio: 2.4 (calc) (ref 1.0–2.5)
ALT: 39 U/L (ref 9–46)
AST: 17 U/L (ref 10–40)
Albumin: 5 g/dL (ref 3.6–5.1)
Alkaline phosphatase (APISO): 53 U/L (ref 36–130)
BUN: 15 mg/dL (ref 7–25)
CO2: 29 mmol/L (ref 20–32)
Calcium: 9.9 mg/dL (ref 8.6–10.3)
Chloride: 104 mmol/L (ref 98–110)
Creat: 1.14 mg/dL (ref 0.60–1.29)
Globulin: 2.1 g/dL (ref 1.9–3.7)
Glucose, Bld: 94 mg/dL (ref 65–99)
Potassium: 5.1 mmol/L (ref 3.5–5.3)
Sodium: 140 mmol/L (ref 135–146)
Total Bilirubin: 1.3 mg/dL — ABNORMAL HIGH (ref 0.2–1.2)
Total Protein: 7.1 g/dL (ref 6.1–8.1)
eGFR: 83 mL/min/1.73m2 (ref >=60)

## 2023-10-09 LAB — RPR TITER: RPR Titer: 1:2 {titer} — ABNORMAL HIGH

## 2023-10-09 LAB — T-HELPER CELLS (CD4) COUNT (NOT AT ARMC)
Absolute CD4: 839 {cells}/uL (ref 490–1740)
CD4 T Helper %: 42 % (ref 30–61)
Total lymphocyte count: 1978 {cells}/uL (ref 850–3900)

## 2023-10-09 LAB — LIPID PANEL
Cholesterol: 220 mg/dL — ABNORMAL HIGH (ref <200)
HDL: 60 mg/dL (ref >=40)
LDL Cholesterol (Calc): 139 mg/dL — ABNORMAL HIGH
Non-HDL Cholesterol (Calc): 160 mg/dL — ABNORMAL HIGH (ref <130)
Total CHOL/HDL Ratio: 3.7 (calc) (ref <5.0)
Triglycerides: 98 mg/dL (ref <150)

## 2023-10-09 LAB — RPR: RPR Ser Ql: REACTIVE — AB

## 2023-10-09 LAB — HIV-1 RNA QUANT-NO REFLEX-BLD
HIV 1 RNA Quant: NOT DETECTED {copies}/mL
HIV-1 RNA Quant, Log: NOT DETECTED {Log_copies}/mL

## 2023-10-18 ENCOUNTER — Ambulatory Visit: Admitting: Infectious Diseases

## 2023-11-02 ENCOUNTER — Ambulatory Visit: Admitting: Family
